# Patient Record
Sex: Male | Born: 1990 | Race: Black or African American | Hispanic: No | Marital: Single | Smoking: Current some day smoker
Health system: Southern US, Community
[De-identification: ages and names within clinical notes are randomized; demographics above are authoritative.]

## PROBLEM LIST (undated history)

## (undated) DIAGNOSIS — B20 Human immunodeficiency virus [HIV] disease: Secondary | ICD-10-CM

## (undated) DIAGNOSIS — B029 Zoster without complications: Secondary | ICD-10-CM

## (undated) DIAGNOSIS — L910 Hypertrophic scar: Secondary | ICD-10-CM

## (undated) HISTORY — PX: KELOID EXCISION: SHX1856

---

## 2011-03-22 ENCOUNTER — Encounter (HOSPITAL_COMMUNITY): Payer: Self-pay | Admitting: Emergency Medicine

## 2011-03-22 ENCOUNTER — Emergency Department (HOSPITAL_COMMUNITY)
Admission: EM | Admit: 2011-03-22 | Discharge: 2011-03-22 | Disposition: A | Discharge status: Home / Self Care | Payer: Medicaid Other | Attending: Emergency Medicine | Admitting: Emergency Medicine

## 2011-03-22 DIAGNOSIS — L509 Urticaria, unspecified: Secondary | ICD-10-CM | POA: Insufficient documentation

## 2011-03-22 DIAGNOSIS — F17200 Nicotine dependence, unspecified, uncomplicated: Secondary | ICD-10-CM | POA: Insufficient documentation

## 2011-03-22 DIAGNOSIS — L299 Pruritus, unspecified: Secondary | ICD-10-CM | POA: Insufficient documentation

## 2011-03-22 HISTORY — DX: Hypertrophic scar: L91.0

## 2011-03-22 MED ORDER — PREDNISONE 50 MG PO TABS: ORAL_TABLET | ORAL | Status: AC

## 2011-03-22 MED ORDER — DIPHENHYDRAMINE HCL 25 MG PO CAPS
25.0000 mg | ORAL_CAPSULE | Freq: Once | ORAL | Status: AC
  Administered 2011-03-22: 25 mg via ORAL
  Filled 2011-03-22: qty 1

## 2011-03-22 NOTE — ED Provider Notes (Signed)
I saw and evaluated the patient, reviewed the resident's note and I agree with the findings and plan. 21 year old, male, no significant past medical history.tattoos.  Yesterday.  Following that he developed a purpuric rash on his extremities.  He denies tightness in his throat.  He denies nausea, vomiting, lightheadedness, cough, wheezing, or difficulty breathing.  It appears that he got hives on his upper extremities.  He is in no, distress.  We'll give him Benadryl and steroids.  There is no other indication of anaphylaxis and no epinephrine or more aggressive treatment is indicated  Nicholes Stairs, MD 03/22/11 1022

## 2011-03-22 NOTE — Discharge Instructions (Signed)

## 2011-03-22 NOTE — ED Provider Notes (Signed)
I saw and evaluated the patient, reviewed the resident's note and I agree with the findings and plan.  Adan Baehr P Jeremian Whitby, MD 03/22/11 1608 

## 2011-03-22 NOTE — ED Notes (Signed)
Patient c/o allergic reaction. Per patient got new tattoo on chest last night, 3 hours later broke out in rash on arms bilaterally.

## 2011-03-22 NOTE — ED Provider Notes (Signed)
History     CSN: [id number]  Arrival date & time 03/22/11  1478   First MD Initiated Contact with Patient 03/22/11 0957      Chief Complaint  Patient presents with  . Allergic Reaction    HPI Patient states that last night he got a new tattoo.  He says before he went to bed, he noticed some red hives on his arms.  He says they were still there when he woke up this morning, and they itched a little.  He denies any throat or mouth swelling, any wheezing, shortness of breath.   The patient states this is his second tattoo and he did not have this reaction before, but he did go to a new tattoo parlor and believes they use a different brand of ink.   Past Medical History  Diagnosis Date  . Asthma   . Keloid scar of skin     Past Surgical History  Procedure Date  . Keloid excision     History reviewed. No pertinent family history.  History  Substance Use Topics  . Smoking status: Current Everyday Smoker    Types: Cigars  . Smokeless tobacco: Never Used  . Alcohol Use: No      Review of Systems  Constitutional: Negative for fever and diaphoresis.  HENT: Negative for congestion.   Eyes: Negative for visual disturbance.  Respiratory: Negative for shortness of breath and wheezing.   Cardiovascular: Negative for palpitations.  Gastrointestinal: Negative for abdominal pain.  Genitourinary: Negative for dysuria.  Musculoskeletal: Negative for back pain.  Skin: Positive for rash.  Neurological: Negative for numbness.  Hematological: Negative for adenopathy.    Allergies  Review of patient's allergies indicates no known allergies.  Home Medications  No current outpatient prescriptions on file.  BP 140/89  Pulse 94  Temp(Src) 98.5 F (36.9 C) (Oral)  Resp 18  Ht 5\' 11"  (1.803 m)  Wt 167 lb (75.751 kg)  BMI 23.29 kg/m2  SpO2 100%  Physical Exam  Constitutional: He is oriented to person, place, and time. He appears well-developed and well-nourished. No distress.    HENT:  Head: Normocephalic and atraumatic.  Nose: Nose normal.  Mouth/Throat: Oropharynx is clear and moist.  Eyes: Conjunctivae and EOM are normal. Pupils are equal, round, and reactive to light.  Neck: Normal range of motion. Neck supple.  Cardiovascular: Normal rate, regular rhythm, normal heart sounds and intact distal pulses.   Pulmonary/Chest: Effort normal and breath sounds normal. No respiratory distress. He has no wheezes.  Musculoskeletal: He exhibits no edema.  Neurological: He is alert and oriented to person, place, and time.  Skin:       Pt has several hives on left arm.  No other visible rash.     ED Course  Procedures (including critical care time)  Labs Reviewed - No data to display No results found.   1. Hives       MDM  Allergic reaction, likely to Tattoo ink.  Diagnosis discussed with patient.  Benadryl and Prednisone advised.  Pt advised to avoid tattoo ink in the future.        Ardyth Gal, MD 03/22/11 1029  Ardyth Gal, MD 03/22/11 1036

## 2011-07-01 ENCOUNTER — Emergency Department (HOSPITAL_COMMUNITY)
Admission: EM | Admit: 2011-07-01 | Discharge: 2011-07-01 | Disposition: A | Discharge status: Home / Self Care | Payer: Medicaid Other | Attending: Emergency Medicine | Admitting: Emergency Medicine

## 2011-07-01 ENCOUNTER — Encounter (HOSPITAL_COMMUNITY): Payer: Self-pay

## 2011-07-01 DIAGNOSIS — F17200 Nicotine dependence, unspecified, uncomplicated: Secondary | ICD-10-CM | POA: Insufficient documentation

## 2011-07-01 DIAGNOSIS — B029 Zoster without complications: Secondary | ICD-10-CM

## 2011-07-01 MED ORDER — PREDNISONE 50 MG PO TABS: 50.0000 mg | ORAL_TABLET | Freq: Every day | ORAL | Status: DC

## 2011-07-01 MED ORDER — VALACYCLOVIR HCL 1 G PO TABS: 1000.0000 mg | ORAL_TABLET | Freq: Three times a day (TID) | ORAL | Status: DC

## 2011-07-01 MED ORDER — OXYCODONE-ACETAMINOPHEN 5-325 MG PO TABS: 1.0000 | ORAL_TABLET | ORAL | Status: DC | PRN

## 2011-07-01 NOTE — ED Provider Notes (Signed)
History  This chart was scribed for Jon Booze, MD by Bennett Scrape. This patient was seen in room STRE7/STRE7 and the patient's care was started at 7:21PM.  CSN: [id number]  Arrival date & time 07/01/11  4098   First MD Initiated Contact with Patient 07/01/11 1921      Chief Complaint  Patient presents with  . Rash     The history is provided by the patient. No language interpreter was used.    Jon Lewis is a 21 y.o. male with a h/o asthma who presents to the Emergency Department complaining of a gradual onset, gradually worsening, constant rash described as blisters with a burning sensation to his left collarbone that appeared last night. He rates his pain a 4 or 5 out of 10. The pain is worse with the rub of clothing. He did not try any OTC medications to improve his symptoms. He also c/o enlarged lymph nodes in his neck on the left side. He denies any other symptoms. He is a current everyday smoker but denies alcohol use.  Dr. Normand Sloop is his PCP.  Past Medical History  Diagnosis Date  . Asthma   . Keloid scar of skin     Past Surgical History  Procedure Date  . Keloid excision     No family history on file.  History  Substance Use Topics  . Smoking status: Current Everyday Smoker    Types: Cigars  . Smokeless tobacco: Never Used  . Alcohol Use: No      Review of Systems  Constitutional: Negative for fever and chills.  Respiratory: Negative for shortness of breath.   Gastrointestinal: Negative for nausea and vomiting.  Skin: Positive for rash.  Neurological: Negative for weakness.    Allergies  Review of patient's allergies indicates no known allergies.  Home Medications  No current outpatient prescriptions on file.  Triage Vitals: BP 126/81  Pulse 105  Temp(Src) 98.3 F (36.8 C) (Oral)  Resp 14  SpO2 100%  Physical Exam  Nursing note and vitals reviewed. Constitutional: He is oriented to person, place, and time. He appears well-developed  and well-nourished. No distress.  HENT:  Head: Normocephalic and atraumatic.  Eyes: EOM are normal.  Neck: Neck supple. No tracheal deviation present.       Several enlarged left posterior cervical lymph nodes  Cardiovascular: Normal rate and regular rhythm.   Pulmonary/Chest: Effort normal and breath sounds normal. No respiratory distress.  Abdominal: He exhibits no distension.  Musculoskeletal: Normal range of motion.  Neurological: He is alert and oriented to person, place, and time.  Skin: Skin is warm and dry.       vesicular rash in the left upper chest anteriorly and laterally with one small patch in the left lateral neck  Psychiatric: He has a normal mood and affect. His behavior is normal.    ED Course  Procedures (including critical care time)  DIAGNOSTIC STUDIES: Oxygen Saturation is 100% on room air, normal by my interpretation.    COORDINATION OF CARE: 7:25PM-Discussed discharge plan of medications with pt and pt agreed to plan.    1. Herpes zoster       MDM  Rash appears to be mainly in a dermatome pattern. The fact that it is painful and not itchy and is strongly favors herpes zoster over any kind of contact dermatitis. He is sad discharged with a prescription for valacyclovir ear, prednisone, and Percocet.   I personally performed the services described in this documentation, which  was scribed in my presence. The recorded information has been reviewed and considered.         Jon Booze, MD 07/04/11 1505

## 2011-07-01 NOTE — ED Notes (Addendum)
Pt. Noticed a rash to  His lt. Anterior above his breast bone  They appear to be small fluid filled pockets pt. Reports them to be painful.  ? Poison

## 2011-07-01 NOTE — Discharge Instructions (Signed)
Shingles Shingles is caused by the same virus that causes chickenpox (varicella zoster virus or VZV). Shingles often occurs many years or decades after having chickenpox. That is why it is more common in adults older than 50 years. The virus reactivates and breaks out as an infection in a nerve root. SYMPTOMS   The initial feeling (sensations) may be pain. This pain is usually described as:   Burning.   Stabbing.   Throbbing.   Tingling in the nerve root.   A red rash will follow in a couple days. The rash may occur in any area of the body and is usually on one side (unilateral) of the body in a band or belt-like pattern. The rash usually starts out as very small blisters (vesicles). They will dry up after 7 to 10 days. This is not usually a significant problem except for the pain it causes.   Long-lasting (chronic) pain is more likely in an elderly person. It can last months to years. This condition is called postherpetic neuralgia.  Shingles can be an extremely severe infection in someone with AIDS, a weakened immune system, or with forms of leukemia. It can also be severe if you are taking transplant medicines or other medicines that weaken the immune system. TREATMENT  Your caregiver will often treat you with:  Antiviral drugs.   Anti-inflammatory drugs.   Pain medicines.  Bed rest is very important in preventing the pain associated with herpes zoster (postherpetic neuralgia). Application of heat in the form of a hot water bottle or electric heating pad or gentle pressure with the hand is recommended to help with the pain or discomfort. PREVENTION  A varicella zoster vaccine is available to help protect against the virus. The Food and Drug Administration approved the varicella zoster vaccine for individuals 47 years of age and older. HOME CARE INSTRUCTIONS   Cool compresses to the area of rash may be helpful.   Only take over-the-counter or prescription medicines for pain,  discomfort, or fever as directed by your caregiver.   Avoid contact with:   Babies.   Pregnant women.   Children with eczema.   Elderly people with transplants.   People with chronic illnesses, such as leukemia and AIDS.   If the area involved is on your face, you may receive a referral for follow-up to a specialist. It is very important to keep all follow-up appointments. This will help avoid eye complications, chronic pain, or disability.  SEEK IMMEDIATE MEDICAL CARE IF:   You develop any pain (headache) in the area of the face or eye. This must be followed carefully by your caregiver or ophthalmologist. An infection in part of your eye (cornea) can be very serious. It could lead to blindness.   You do not have pain relief from prescribed medicines.   Your redness or swelling spreads.   The area involved becomes very swollen and painful.   You have a fever.   You notice any red or painful lines extending away from the affected area toward your heart (lymphangitis).   Your condition is worsening or has changed.  Document Released: 01/18/2005 Document Revised: 01/07/2011 Document Reviewed: 12/23/2008 [facility] Patient Information 2012 [place], [place].  Valacyclovir caplets What is this medicine? VALACYCLOVIR (val ay SYE kloe veer) is an antiviral medicine. It is used to treat or prevent infections caused by certain kinds of viruses. Examples of these infections include herpes and shingles. This medicine will not cure herpes. This medicine may be used for other  purposes; ask your health care provider or pharmacist if you have questions. What should I tell my health care provider before I take this medicine? They need to know if you have any of these conditions: -acquired immunodeficiency syndrome (AIDS) -any other condition that may weaken the immune system -bone marrow or kidney transplant -kidney disease -an unusual or allergic reaction to valacyclovir, acyclovir,  ganciclovir, valganciclovir, other medicines, foods, dyes, or preservatives -pregnant or trying to get pregnant -breast-feeding How should I use this medicine? Take this medicine by mouth with a glass of water. Follow the directions on the prescription label. You can take this medicine with or without food. Take your doses at regular intervals. Do not take your medicine more often than directed. Finish the full course prescribed by your doctor or health care professional even if you think your condition is better. Do not stop taking except on the advice of your doctor or health care professional. Talk to your pediatrician regarding the use of this medicine in children. While this drug may be prescribed for children as young as 2 years for selected conditions, precautions do apply. Overdosage: If you think you have taken too much of this medicine contact a poison control center or emergency room at once. NOTE: This medicine is only for you. Do not share this medicine with others. What if I miss a dose? If you miss a dose, take it as soon as you can. If it is almost time for your next dose, take only that dose. Do not take double or extra doses. What may interact with this medicine? -cimetidine -probenecid This list may not describe all possible interactions. Give your health care provider a list of all the medicines, herbs, non-prescription drugs, or dietary supplements you use. Also tell them if you smoke, drink alcohol, or use illegal drugs. Some items may interact with your medicine. What should I watch for while using this medicine? Tell your doctor or health care professional if your symptoms do not start to get better after 1 week. This medicine works best when taken early in the course of an infection, within the first 72 hours. Begin treatment as soon as possible after the first signs of infection like tingling, itching, or pain in the affected area. It is possible that genital herpes may  still be spread even when you are not having symptoms. Always use safer sex practices like condoms made of latex or polyurethane whenever you have sexual contact. You should stay well hydrated while taking this medicine. Drink plenty of fluids. What side effects may I notice from receiving this medicine? Side effects that you should report to your doctor or health care professional as soon as possible: -allergic reactions like skin rash, itching or hives, swelling of the face, lips, or tongue -aggressive behavior -confusion -hallucinations -problems with balance, talking, walking -stomach pain -tremor -trouble passing urine or change in the amount of urine Side effects that usually do not require medical attention (report to your doctor or health care professional if they continue or are bothersome): -dizziness -headache -nausea, vomiting This list may not describe all possible side effects. Call your doctor for medical advice about side effects. You may report side effects to FDA at 1-800-FDA-1088. Where should I keep my medicine? Keep out of the reach of children. Store at room temperature between 15 and 25 degrees C (59 and 77 degrees F). Keep container tightly closed. Throw away any unused medicine after the expiration date. NOTE: This sheet is a  summary. It may not cover all possible information. If you have questions about this medicine, talk to your doctor, pharmacist, or health care provider.  2012, [employer]. (03/27/2007 6:10:08 PM)  Prednisone tablets What is this medicine? PREDNISONE (PRED ni sone) is a corticosteroid. It is commonly used to treat inflammation of the skin, joints, lungs, and other organs. Common conditions treated include asthma, allergies, and arthritis. It is also used for other conditions, such as blood disorders and diseases of the adrenal glands. This medicine may be used for other purposes; ask your health care provider or pharmacist if you  have questions. What should I tell my health care provider before I take this medicine? They need to know if you have any of these conditions: -Cushing's syndrome -diabetes -glaucoma -heart disease -high blood pressure -infection (especially a virus infection such as chickenpox, cold sores, or herpes) -kidney disease -liver disease -mental illness -myasthenia gravis -osteoporosis -seizures -stomach or intestine problems -thyroid disease -an unusual or allergic reaction to lactose, prednisone, other medicines, foods, dyes, or preservatives -pregnant or trying to get pregnant -breast-feeding How should I use this medicine? Take this medicine by mouth with a glass of water. Follow the directions on the prescription label. Take this medicine with food. If you are taking this medicine once a day, take it in the morning. Do not take more medicine than you are told to take. Do not suddenly stop taking your medicine because you may develop a severe reaction. Your doctor will tell you how much medicine to take. If your doctor wants you to stop the medicine, the dose may be slowly lowered over time to avoid any side effects. Talk to your pediatrician regarding the use of this medicine in children. Special care may be needed. Overdosage: If you think you have taken too much of this medicine contact a poison control center or emergency room at once. NOTE: This medicine is only for you. Do not share this medicine with others. What if I miss a dose? If you miss a dose, take it as soon as you can. If it is almost time for your next dose, talk to your doctor or health care professional. You may need to miss a dose or take an extra dose. Do not take double or extra doses without advice. What may interact with this medicine? Do not take this medicine with any of the following medications: -metyrapone -mifepristone This medicine may also interact with the following  medications: -aminoglutethimide -amphotericin B -aspirin and aspirin-like medicines -barbiturates -certain medicines for diabetes, like glipizide or glyburide -cholestyramine -cholinesterase inhibitors -cyclosporine -digoxin -diuretics -ephedrine -male hormones, like estrogens and birth control pills -isoniazid -ketoconazole -NSAIDS, medicines for pain and inflammation, like ibuprofen or naproxen -phenytoin -rifampin -toxoids -vaccines -warfarin This list may not describe all possible interactions. Give your health care provider a list of all the medicines, herbs, non-prescription drugs, or dietary supplements you use. Also tell them if you smoke, drink alcohol, or use illegal drugs. Some items may interact with your medicine. What should I watch for while using this medicine? Visit your doctor or health care professional for regular checks on your progress. If you are taking this medicine over a prolonged period, carry an identification card with your name and address, the type and dose of your medicine, and your doctor's name and address. This medicine may increase your risk of getting an infection. Tell your doctor or health care professional if you are around anyone with measles or chickenpox, or if  you develop sores or blisters that do not heal properly. If you are going to have surgery, tell your doctor or health care professional that you have taken this medicine within the last twelve months. Ask your doctor or health care professional about your diet. You may need to lower the amount of salt you eat. This medicine may affect blood sugar levels. If you have diabetes, check with your doctor or health care professional before you change your diet or the dose of your diabetic medicine. What side effects may I notice from receiving this medicine? Side effects that you should report to your doctor or health care professional as soon as possible: -allergic reactions like skin rash,  itching or hives, swelling of the face, lips, or tongue -changes in emotions or moods -changes in vision -depressed mood -eye pain -fever or chills, cough, sore throat, pain or difficulty passing urine -increased thirst -swelling of ankles, feet Side effects that usually do not require medical attention (report to your doctor or health care professional if they continue or are bothersome): -confusion, excitement, restlessness -headache -nausea, vomiting -skin problems, acne, thin and shiny skin -trouble sleeping -weight gain This list may not describe all possible side effects. Call your doctor for medical advice about side effects. You may report side effects to FDA at 1-800-FDA-1088. Where should I keep my medicine? Keep out of the reach of children. Store at room temperature between 15 and 30 degrees C (59 and 86 degrees F). Protect from light. Keep container tightly closed. Throw away any unused medicine after the expiration date. NOTE: This sheet is a summary. It may not cover all possible information. If you have questions about this medicine, talk to your doctor, pharmacist, or health care provider.  2012, [employer]. (09/03/2010 10:57:14 AM)  Acetaminophen; Oxycodone tablets What is this medicine? ACETAMINOPHEN; OXYCODONE (a set a MEE noe fen; ox i KOE done) is a pain reliever. It is used to treat mild to moderate pain. This medicine may be used for other purposes; ask your health care provider or pharmacist if you have questions. What should I tell my health care provider before I take this medicine? They need to know if you have any of these conditions: -brain tumor -Crohn's disease, inflammatory bowel disease, or ulcerative colitis -drink more than 3 alcohol containing drinks per day -drug abuse or addiction -head injury -heart or circulation problems -kidney disease or problems going to the bathroom -liver disease -lung disease, asthma, or breathing  problems -an unusual or allergic reaction to acetaminophen, oxycodone, other opioid analgesics, other medicines, foods, dyes, or preservatives -pregnant or trying to get pregnant -breast-feeding How should I use this medicine? Take this medicine by mouth with a full glass of water. Follow the directions on the prescription label. Take your medicine at regular intervals. Do not take your medicine more often than directed. Talk to your pediatrician regarding the use of this medicine in children. Special care may be needed. Patients over 17 years old may have a stronger reaction and need a smaller dose. Overdosage: If you think you have taken too much of this medicine contact a poison control center or emergency room at once. NOTE: This medicine is only for you. Do not share this medicine with others. What if I miss a dose? If you miss a dose, take it as soon as you can. If it is almost time for your next dose, take only that dose. Do not take double or extra doses. What may interact  with this medicine? -alcohol or medicines that contain alcohol -antihistamines -barbiturates like amobarbital, butalbital, butabarbital, methohexital, pentobarbital, phenobarbital, thiopental, and secobarbital -benztropine -drugs for bladder problems like solifenacin, trospium, oxybutynin, tolterodine, hyoscyamine, and methscopolamine -drugs for breathing problems like ipratropium and tiotropium -drugs for certain stomach or intestine problems like propantheline, homatropine methylbromide, glycopyrrolate, atropine, belladonna, and dicyclomine -general anesthetics like etomidate, ketamine, nitrous oxide, propofol, desflurane, enflurane, halothane, isoflurane, and sevoflurane -medicines for depression, anxiety, or psychotic disturbances -medicines for pain like codeine, morphine, pentazocine, buprenorphine, butorphanol, nalbuphine, tramadol, and propoxyphene -medicines for sleep -muscle  relaxants -naltrexone -phenothiazines like perphenazine, thioridazine, chlorpromazine, mesoridazine, fluphenazine, prochlorperazine, promazine, and trifluoperazine -scopolamine -trihexyphenidyl This list may not describe all possible interactions. Give your health care provider a list of all the medicines, herbs, non-prescription drugs, or dietary supplements you use. Also tell them if you smoke, drink alcohol, or use illegal drugs. Some items may interact with your medicine. What should I watch for while using this medicine? Tell your doctor or health care professional if your pain does not go away, if it gets worse, or if you have new or a different type of pain. You may develop tolerance to the medicine. Tolerance means that you will need a higher dose of the medication for pain relief. Tolerance is normal and is expected if you take this medicine for a long time. Do not suddenly stop taking your medicine because you may develop a severe reaction. Your body becomes used to the medicine. This does NOT mean you are addicted. Addiction is a behavior related to getting and using a drug for a nonmedical reason. If you have pain, you have a medical reason to take pain medicine. Your doctor will tell you how much medicine to take. If your doctor wants you to stop the medicine, the dose will be slowly lowered over time to avoid any side effects. You may get drowsy or dizzy. Do not drive, use machinery, or do anything that needs mental alertness until you know how this medicine affects you. Do not stand or sit up quickly, especially if you are an older patient. This reduces the risk of dizzy or fainting spells. Alcohol may interfere with the effect of this medicine. Avoid alcoholic drinks. The medicine will cause constipation. Try to have a bowel movement at least every 2 to 3 days. If you do not have a bowel movement for 3 days, call your doctor or health care professional. Do not take Tylenol (acetaminophen)  or medicines that have acetaminophen with this medicine. Too much acetaminophen can be very dangerous. Many nonprescription medicines contain acetaminophen. Always read the labels carefully to avoid taking more acetaminophen. What side effects may I notice from receiving this medicine? Side effects that you should report to your doctor or health care professional as soon as possible: -allergic reactions like skin rash, itching or hives, swelling of the face, lips, or tongue -breathing difficulties, wheezing -confusion -light headedness or fainting spells -severe stomach pain -yellowing of the skin or the whites of the eyes Side effects that usually do not require medical attention (report to your doctor or health care professional if they continue or are bothersome): -dizziness -drowsiness -nausea -vomiting This list may not describe all possible side effects. Call your doctor for medical advice about side effects. You may report side effects to FDA at 1-800-FDA-1088. Where should I keep my medicine? Keep out of the reach of children. This medicine can be abused. Keep your medicine in a safe place to protect it  from theft. Do not share this medicine with anyone. Selling or giving away this medicine is dangerous and against the law. Store at room temperature between 20 and 25 degrees C (68 and 77 degrees F). Keep container tightly closed. Protect from light. Flush any unused medicines down the toilet. Do not use the medicine after the expiration date. NOTE: This sheet is a summary. It may not cover all possible information. If you have questions about this medicine, talk to your doctor, pharmacist, or health care provider.  2012, [employer]. (12/18/2007 10:01:21 AM)

## 2011-07-07 ENCOUNTER — Encounter (HOSPITAL_COMMUNITY): Payer: Self-pay | Admitting: Emergency Medicine

## 2011-07-07 ENCOUNTER — Emergency Department (HOSPITAL_COMMUNITY)
Admission: EM | Admit: 2011-07-07 | Discharge: 2011-07-07 | Disposition: A | Discharge status: Home / Self Care | Payer: Medicaid Other | Attending: Emergency Medicine | Admitting: Emergency Medicine

## 2011-07-07 DIAGNOSIS — B029 Zoster without complications: Secondary | ICD-10-CM | POA: Insufficient documentation

## 2011-07-07 DIAGNOSIS — F17200 Nicotine dependence, unspecified, uncomplicated: Secondary | ICD-10-CM | POA: Insufficient documentation

## 2011-07-07 HISTORY — DX: Zoster without complications: B02.9

## 2011-07-07 MED ORDER — OXYCODONE-ACETAMINOPHEN 5-325 MG PO TABS: 1.0000 | ORAL_TABLET | ORAL | Status: AC | PRN

## 2011-07-07 MED ORDER — NAPROXEN 500 MG PO TABS: 500.0000 mg | ORAL_TABLET | Freq: Two times a day (BID) | ORAL | Status: DC

## 2011-07-07 NOTE — Discharge Instructions (Signed)
Shingles Shingles is caused by the same virus that causes chickenpox (varicella zoster virus or VZV). Shingles often occurs many years or decades after having chickenpox. That is why it is more common in adults older than 50 years. The virus reactivates and breaks out as an infection in a nerve root. SYMPTOMS   The initial feeling (sensations) may be pain. This pain is usually described as:   Burning.   Stabbing.   Throbbing.   Tingling in the nerve root.   A red rash will follow in a couple days. The rash may occur in any area of the body and is usually on one side (unilateral) of the body in a band or belt-like pattern. The rash usually starts out as very small blisters (vesicles). They will dry up after 7 to 10 days. This is not usually a significant problem except for the pain it causes.   Long-lasting (chronic) pain is more likely in an elderly person. It can last months to years. This condition is called postherpetic neuralgia.  Shingles can be an extremely severe infection in someone with AIDS, a weakened immune system, or with forms of leukemia. It can also be severe if you are taking transplant medicines or other medicines that weaken the immune system. TREATMENT  Your caregiver will often treat you with:  Antiviral drugs.   Anti-inflammatory drugs.   Pain medicines.  Bed rest is very important in preventing the pain associated with herpes zoster (postherpetic neuralgia). Application of heat in the form of a hot water bottle or electric heating pad or gentle pressure with the hand is recommended to help with the pain or discomfort. PREVENTION  A varicella zoster vaccine is available to help protect against the virus. The Food and Drug Administration approved the varicella zoster vaccine for individuals 15 years of age and older. HOME CARE INSTRUCTIONS   Cool compresses to the area of rash may be helpful.   Only take over-the-counter or prescription medicines for pain,  discomfort, or fever as directed by your caregiver.   Avoid contact with:   Babies.   Pregnant women.   Children with eczema.   Elderly people with transplants.   People with chronic illnesses, such as leukemia and AIDS.   If the area involved is on your face, you may receive a referral for follow-up to a specialist. It is very important to keep all follow-up appointments. This will help avoid eye complications, chronic pain, or disability.  SEEK IMMEDIATE MEDICAL CARE IF:   You develop any pain (headache) in the area of the face or eye. This must be followed carefully by your caregiver or ophthalmologist. An infection in part of your eye (cornea) can be very serious. It could lead to blindness.   You do not have pain relief from prescribed medicines.   Your redness or swelling spreads.   The area involved becomes very swollen and painful.   You have a fever.   You notice any red or painful lines extending away from the affected area toward your heart (lymphangitis).   Your condition is worsening or has changed.  Document Released: 01/18/2005 Document Revised: 01/07/2011 Document Reviewed: 12/23/2008 [facility] Patient Information 2012 [place], [place].RESOURCE GUIDE  Chronic Pain Problems: Contact [facility]  [phone] Patients need to be referred by their primary care doctor.  Insufficient Money for Medicine: Contact United Way:  call "211" or [facility] ([phone].  No Primary Care Doctor: - Call [facility]  [phone] - can  help you locate a primary care doctor that  accepts your insurance, provides certain services, etc. - Physician Referral Service- [phone]  Agencies that provide inexpensive medical care: - [facility]  [phone] - [facility]  [phone] - [facility]  [phone] - Women's Clinic  [phone] - Planned Parenthood  [phone] [facility]   [phone]  Medicaid-accepting [facility] Providers: - [facility]- [address]  [phone], Mon-Fri 9am-7pm, Sat 9am-1pm - [facility]- [address] [place], [address] [place]  [phone] - [facility]- [address], [address] [place]  [phone] [facility]- [address]  [phone] - Renaye Rakers- [address] [place], [address], [phone]  Only accepts [place] patients after they have their name  applied to their card  Self Pay (no insurance) in [place]: - Sickle Cell Patients: Dr Willey Blade, [facility]  [address] [place], [phone] - [facility]- [address] [place]  [phone]       [facility] [place]- [address]       -     [facility]- see information above (Speak to [employer] if you do not have insurance)       -  [facility]- [address] [place], [phone]       -  [facility]- [address],  [phone]       -  [facility]- [address], [phone]       -  Dr Julio Sicks-  [address], [address], [place], [phone]       -  [facility]- [address] [phone]       -  [facility]- [address], [phone], also [address], [phone]       -    [facility]- [address] [place], [phone], 1st & 3rd Saturday   every month, 10am-1pm  1) Find a Doctor and Pay Out of Pocket Although you won't have to find out who is covered by your insurance plan, it is a good idea to ask around and get recommendations. You will then need to call the office and see if the doctor you have chosen will accept you as a new patient and what types of options they offer for patients who are self-pay. Some doctors offer discounts or will set up payment plans for their patients who do not have insurance, but you will need to ask so you aren't  surprised when you get to your appointment.  2) Contact Your Local Health Department Not all health departments have doctors that can see patients for sick visits, but many do, so it is worth a call to see if yours does. If you don't know where your local health department is, you can check in your phone book. The CDC also has a tool to help you locate your state's health department, and many state websites also have listings of all of their local health departments.  3) Find a Walk-in Clinic If your illness is not likely to be very severe or complicated, you may want to try a walk in clinic. These are popping up all over the country in pharmacies, drugstores, and shopping centers. They're usually staffed  by nurse practitioners or physician assistants that have been trained to treat common illnesses and complaints. They're usually fairly quick and inexpensive. However, if you have serious medical issues or chronic medical problems, these are probably not your best option  STD Testing - [facility] Department of [facility], STD Clinic, [address], [place], phone [phone] or [phone].  Monday - Friday, call for an appointment. [facility] Department of [employer], STD Clinic, [address], [place], phone [phone] or [phone].  Monday - Friday, call for an appointment.  Abuse/Neglect: [facility] Child Abuse Hotline (506)504-2586 [facility] Child Abuse Hotline 6613880065 (After Hours)  Emergency Shelter:  [facility] (442)748-1125  Maternity Homes: - Room at the [place] [facility] 9025379609 - [facility] [phone]  MRSA Hotline #:   302-741-0455  [facility] Resources  [facility] [place]  United Way [facility] Dept. [address]                 [address]         [address] [place] [address]  Blondell Reveal Phone:  [phone]                                  Phone:  [phone]                   Phone:  [phone]  [facility], [phone] - [phone]       -     [facility] in [place], [address]                                  [phone], [facility] Child Abuse Hotline 623 628 0644 or 367-392-1781 (After Hours)   Behavioral Health Services  Substance Abuse Resources: - [facility] Drug Services  (762)182-9563 - [facility] [phone] - [facility] [place] [phone] Floydene Flock [phone] - Residential & Outpatient Substance Abuse Program  (815)[phone]  Psychological Services: [facility]  [phone] Services  [phone] - [facility], [phone] [place]. [address], [place], ACCESS LINE: [phone] or [phone], [employer]  Dental Assistance  If unable to pay or uninsured, contact:  [facility] or [facility]. to become qualified for the adult dental clinic.  Patients with Medicaid: [facility] [phone] [address] Qu[address], [phone] [address] [phone]  If unable to pay, or uninsured, contact [facility] [phone]) or [facility] Department [phone] in [place], [phone] in [facility]) to become qualified for the adult dental clinic  Other Low-Cost Community Dental Services: - [facility]- [address] [place], [place], [place], [address], [phone], [phone], 2nd and 4th Thursday of the  month at 6:30am.  10 clients each day by appointment, can sometimes see walk-in patients if someone does not show for an appointment. [facility]- [address] [place] Griffins [place], [place], [address], [phone] - [facility]- [address] [place], [place], [address], [phone] - [facility]- [phone] [facility]- [phone] [facility] [phone] -

## 2011-07-07 NOTE — ED Notes (Signed)
States  He needs a refill on his pain medication. Patient has been taking medication for the shingles x 1 week. Area is dry and scabbed over.

## 2011-07-07 NOTE — ED Notes (Signed)
Pt c/o needing pain med refill for shingles; pt sts getting better but still having pain

## 2011-07-07 NOTE — ED Provider Notes (Signed)
History   This chart was scribed for Jon Kras, MD by Melba Coon. The patient was seen in room STRE4/STRE4 and the patient's care was started at 2:17PM.  CSN: [id number] Arrival date & time 07/07/11  1349     Chief Complaint  Patient presents with  . Medication Refill    HPI Jon Lewis is a 21 y.o. male who presents to the Emergency Department needing a medication refill for shingles. Pt has had shingles for a week. Pt sx have been progressively getting better but pain is still present. No HA, fever, neck pain, sore throat, rash, back pain, CP, SOB, abd pain, n/v/d, dysuria, or extremity pain, edema, weakness, numbness, or tingling. No known allergies. No other pertinent medical symptoms. Patient was previously seen in the emergency department and started on oxycodone, valacyclovir and prednisone. The patient states he came to emergency however refill of his medications because it persists. He does not have a doctor in the area.  Past Medical History  Diagnosis Date  . Asthma   . Keloid scar of skin   . Shingles     Past Surgical History  Procedure Date  . Keloid excision     History reviewed. No pertinent family history.  History  Substance Use Topics  . Smoking status: Current Everyday Smoker    Types: Cigars  . Smokeless tobacco: Never Used  . Alcohol Use: No      Review of Systems 10 Systems reviewed and all are negative for acute change except as noted in the HPI.   Allergies  Review of patient's allergies indicates no known allergies.  Home Medications   Current Outpatient Rx  Name Route Sig Dispense Refill  . OXYCODONE-ACETAMINOPHEN 5-325 MG PO TABS Oral Take 1 tablet by mouth every 4 (four) hours as needed. For pain    . PREDNISONE 50 MG PO TABS Oral Take 50 mg by mouth daily.    Marland Kitchen VALACYCLOVIR HCL 1 G PO TABS Oral Take 1,000 mg by mouth 3 (three) times daily.      BP 145/82  Pulse 72  Temp(Src) 98 F (36.7 C) (Oral)  Resp 16  SpO2  100%  Physical Exam  Nursing note and vitals reviewed. Constitutional: He appears well-developed and well-nourished. No distress.  HENT:  Head: Normocephalic and atraumatic.  Right Ear: External ear normal.  Left Ear: External ear normal.  Eyes: Conjunctivae are normal. Right eye exhibits no discharge. Left eye exhibits no discharge. No scleral icterus.  Neck: Neck supple. No tracheal deviation present.  Cardiovascular: Normal rate.   Pulmonary/Chest: Effort normal. No stridor. No respiratory distress. He exhibits tenderness (Left anterior chest well with healing, scabbing area in pattern c/w herpes zoster).  Musculoskeletal: He exhibits no edema.  Neurological: He is alert. Cranial nerve deficit: no gross deficits.  Skin: Skin is warm and dry. No rash noted.  Psychiatric: He has a normal mood and affect.    ED Course  Procedures (including critical care time)  COORDINATION OF CARE:  2;20PM - EDMD will order pain meds for the pt.   Labs Reviewed - No data to display No results found.   1. Shingles       MDM  The patient's shingles seems to be healing. There is no evidence of bacterial superinfection. I did prescribe the patient additional pain medications and is requested. I recommended followup with a primary care Dr. as if the symptoms persist he may be a candidate for a medication such as gabapentin  I personally performed the services described in this documentation, which was scribed in my presence.  The recorded information has been reviewed and considered.        Jon Kras, MD 07/07/11 [phone]

## 2011-07-22 ENCOUNTER — Encounter (HOSPITAL_COMMUNITY): Payer: Self-pay | Admitting: *Deleted

## 2011-07-22 ENCOUNTER — Emergency Department (HOSPITAL_COMMUNITY)
Admission: EM | Admit: 2011-07-22 | Discharge: 2011-07-22 | Disposition: A | Discharge status: Home / Self Care | Payer: Medicaid Other | Attending: Emergency Medicine | Admitting: Emergency Medicine

## 2011-07-22 DIAGNOSIS — B029 Zoster without complications: Secondary | ICD-10-CM | POA: Insufficient documentation

## 2011-07-22 DIAGNOSIS — L282 Other prurigo: Secondary | ICD-10-CM

## 2011-07-22 DIAGNOSIS — F17200 Nicotine dependence, unspecified, uncomplicated: Secondary | ICD-10-CM | POA: Insufficient documentation

## 2011-07-22 MED ORDER — HYDROXYZINE HCL 25 MG PO TABS: 25.0000 mg | ORAL_TABLET | Freq: Four times a day (QID) | ORAL | Status: AC | PRN

## 2011-07-22 NOTE — ED Notes (Signed)
Patient has been seen here for his shingles.  He states his pain has been controlled but he continues to have itching to his neck and left shoulder

## 2011-07-22 NOTE — ED Provider Notes (Signed)
History  This chart was scribed for Gerhard Munch, MD by Bennett Scrape. This patient was seen in room TR05C/TR05C and the patient's care was started at 7:38PM.  CSN: [id number]  Arrival date & time 07/22/11  1745   First MD Initiated Contact with Patient 07/22/11 1938      Chief Complaint  Patient presents with  . Herpes Zoster  . Rash     The history is provided by the patient. No language interpreter was used.    Jon Lewis is a 21 y.o. male who presents to the Emergency Department complaining of 4 days of itchy rash that he attributes to shingles along the left neck and left shoulder. Pt was diagnosed one month ago with shingles. He denies having any modifying factors. He reports using cream without improvement in symptoms. He denies fevers, emesis and confusion as associated symptoms. He has a h/o asthma. He is a current everyday smoker but denies alcohol use.   Past Medical History  Diagnosis Date  . Asthma   . Keloid scar of skin   . Shingles     Past Surgical History  Procedure Date  . Keloid excision     No family history on file.  History  Substance Use Topics  . Smoking status: Current Everyday Smoker    Types: Cigars  . Smokeless tobacco: Never Used  . Alcohol Use: No      Review of Systems  Constitutional: Negative for chills.  Gastrointestinal: Negative for vomiting.  Skin: Positive for rash (Shingles).  All other systems reviewed and are negative.    Allergies  Review of patient's allergies indicates no known allergies.  Home Medications   Current Outpatient Rx  Name Route Sig Dispense Refill  . OXYCODONE-ACETAMINOPHEN 5-325 MG PO TABS Oral Take 1 tablet by mouth every 4 (four) hours as needed. For pain      Triage Vitals: BP 136/78  Pulse 69  Temp 98.3 F (36.8 C) (Oral)  Resp 16  SpO2 100%  Physical Exam  Nursing note and vitals reviewed. Constitutional: He is oriented to person, place, and time. He appears  well-developed. No distress.  HENT:  Head: Normocephalic and atraumatic.  Eyes: Conjunctivae and EOM are normal.  Cardiovascular: Normal rate.   Pulmonary/Chest: Effort normal. No stridor. No respiratory distress.  Abdominal: He exhibits no distension.  Musculoskeletal: He exhibits no edema.  Neurological: He is alert and oriented to person, place, and time.  Skin: Skin is warm and dry. Rash noted.       Rash characteristic of shingles along the left neck and left shoulder  Psychiatric: He has a normal mood and affect.    ED Course  Procedures (including critical care time)  COORDINATION OF CARE: 7:42PM-Discussed discharge plan with pt and pt agreed to plan.  Labs Reviewed - No data to display No results found.   No diagnosis found.    MDM  I personally performed the services described in this documentation, which was scribed in my presence. The recorded information has been reviewed and considered.  This young male who was diagnosed with herpes zoster 3 weeks ago now presents with concerns over pruritus.  Notably, the patient states that his pain has resolved entirely, and he denies any other focal complaints.  The patient was provided a prescription for hydroxyzine and discharged in stable condition.  Gerhard Munch, MD 07/22/11 [phone]

## 2011-07-22 NOTE — Discharge Instructions (Signed)
Is important that you establish care with a primary care physician to continue management of your condition.  If you develop any new, or concerning changes in your condition, please return to the emergency department immediately

## 2011-08-30 ENCOUNTER — Encounter (HOSPITAL_COMMUNITY): Payer: Self-pay

## 2011-08-30 ENCOUNTER — Emergency Department (HOSPITAL_COMMUNITY): Payer: No Typology Code available for payment source

## 2011-08-30 ENCOUNTER — Emergency Department (HOSPITAL_COMMUNITY)
Admission: EM | Admit: 2011-08-30 | Discharge: 2011-08-30 | Disposition: A | Discharge status: Home / Self Care | Payer: No Typology Code available for payment source | Attending: Emergency Medicine | Admitting: Emergency Medicine

## 2011-08-30 DIAGNOSIS — M545 Low back pain, unspecified: Secondary | ICD-10-CM

## 2011-08-30 DIAGNOSIS — S139XXA Sprain of joints and ligaments of unspecified parts of neck, initial encounter: Secondary | ICD-10-CM

## 2011-08-30 DIAGNOSIS — M542 Cervicalgia: Secondary | ICD-10-CM | POA: Insufficient documentation

## 2011-08-30 MED ORDER — ONDANSETRON HCL 4 MG/2ML IJ SOLN
4.0000 mg | Freq: Once | INTRAMUSCULAR | Status: DC
  Filled 2011-08-30: qty 2

## 2011-08-30 MED ORDER — ONDANSETRON 4 MG PO TBDP
4.0000 mg | ORAL_TABLET | Freq: Once | ORAL | Status: AC
  Administered 2011-08-30: 4 mg via ORAL
  Filled 2011-08-30: qty 1

## 2011-08-30 MED ORDER — MORPHINE SULFATE 4 MG/ML IJ SOLN
4.0000 mg | Freq: Once | INTRAMUSCULAR | Status: DC
  Filled 2011-08-30: qty 1

## 2011-08-30 MED ORDER — OXYCODONE-ACETAMINOPHEN 5-325 MG PO TABS
1.0000 | ORAL_TABLET | Freq: Once | ORAL | Status: AC
  Administered 2011-08-30: 1 via ORAL
  Filled 2011-08-30 (×2): qty 1

## 2011-08-30 MED ORDER — IBUPROFEN 800 MG PO TABS: 800.0000 mg | ORAL_TABLET | Freq: Three times a day (TID) | ORAL | Status: AC

## 2011-08-30 MED ORDER — OXYCODONE-ACETAMINOPHEN 5-325 MG PO TABS: ORAL_TABLET | ORAL | Status: AC
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
## 2011-08-30 NOTE — ED Notes (Signed)
c-collar removed

## 2011-08-30 NOTE — ED Provider Notes (Signed)
Medical screening examination/treatment/procedure(s) were performed by non-physician practitioner and as supervising physician I was immediately available for consultation/collaboration.   Gwyneth Sprout, MD 08/30/11 [phone]
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
## 2011-08-30 NOTE — ED Notes (Signed)
Pt. Removed from the Board by Wynetta Emery. PA

## 2011-11-29 ENCOUNTER — Telehealth: Payer: Self-pay

## 2011-11-29 NOTE — Telephone Encounter (Signed)
error 

## 2011-11-29 NOTE — Telephone Encounter (Signed)
Attempted to reach patient regarding referral from [facility] for  new HIV referral .  Cell number does not list name, no message left.  I will attempt to reach patient on a different date and time with hopes of speaking with the patient directly. I prefer not to leave phone messages on blind voice mails.   Laurell Josephs, RN

## 2011-12-03 ENCOUNTER — Telehealth: Payer: Self-pay

## 2011-12-03 NOTE — Telephone Encounter (Signed)
Message left on cell phone to call Eustacia Urbanek to schedule new patient visit.   Laurell Josephs, RN

## 2011-12-03 NOTE — Telephone Encounter (Signed)
Pt returned call and scheduled visit.   12-21-11 @ 1:30 pm  Laurell Josephs, RN

## 2011-12-21 ENCOUNTER — Ambulatory Visit (INDEPENDENT_AMBULATORY_CARE_PROVIDER_SITE_OTHER): Payer: Medicaid Other

## 2011-12-21 ENCOUNTER — Other Ambulatory Visit (HOSPITAL_COMMUNITY)
Admission: RE | Admit: 2011-12-21 | Discharge: 2011-12-21 | Disposition: A | Discharge status: Home / Self Care | Payer: Medicaid Other | Source: Ambulatory Visit | Attending: Infectious Diseases | Admitting: Infectious Diseases

## 2011-12-21 DIAGNOSIS — Z21 Asymptomatic human immunodeficiency virus [HIV] infection status: Secondary | ICD-10-CM

## 2011-12-21 DIAGNOSIS — Z113 Encounter for screening for infections with a predominantly sexual mode of transmission: Secondary | ICD-10-CM | POA: Insufficient documentation

## 2011-12-21 DIAGNOSIS — Z23 Encounter for immunization: Secondary | ICD-10-CM

## 2011-12-21 DIAGNOSIS — B20 Human immunodeficiency virus [HIV] disease: Secondary | ICD-10-CM

## 2011-12-22 LAB — CBC WITH DIFFERENTIAL/PLATELET
Basophils Relative: 0 % (ref 0–1)
Eosinophils Absolute: 0.2 10*3/uL (ref 0.0–0.7)
Eosinophils Relative: 2 % (ref 0–5)
Hemoglobin: 12.8 g/dL — ABNORMAL LOW (ref 13.0–17.0)
Lymphs Abs: 1.6 10*3/uL (ref 0.7–4.0)
MCH: 30.3 pg (ref 26.0–34.0)
MCHC: 35.1 g/dL (ref 30.0–36.0)
MCV: 86.3 fL (ref 78.0–100.0)
Monocytes Relative: 16 % — ABNORMAL HIGH (ref 3–12)
Neutrophils Relative %: 61 % (ref 43–77)
RBC: 4.23 MIL/uL (ref 4.22–5.81)

## 2011-12-22 LAB — COMPLETE METABOLIC PANEL WITH GFR
Alkaline Phosphatase: 50 U/L (ref 39–117)
BUN: 9 mg/dL (ref 6–23)
CO2: 27 mEq/L (ref 19–32)
Creat: 1.19 mg/dL (ref 0.50–1.35)
GFR, Est African American: >89 mL/min
GFR, Est Non African American: 87 mL/min
Glucose, Bld: 90 mg/dL (ref 70–99)
Total Bilirubin: 0.5 mg/dL (ref 0.3–1.2)

## 2011-12-22 LAB — URINALYSIS
Bilirubin Urine: NEGATIVE
Glucose, UA: NEGATIVE mg/dL
Hgb urine dipstick: NEGATIVE
pH: 6 (ref 5.0–8.0)

## 2011-12-22 LAB — HEPATITIS B CORE ANTIBODY, TOTAL: Hep B Core Total Ab: NEGATIVE

## 2011-12-22 LAB — LIPID PANEL
HDL: 32 mg/dL — ABNORMAL LOW (ref >39)
Total CHOL/HDL Ratio: 4 Ratio
VLDL: 11 mg/dL (ref 0–40)

## 2011-12-22 LAB — T-HELPER CELL (CD4) - (RCID CLINIC ONLY)
CD4 % Helper T Cell: 13 % — ABNORMAL LOW (ref 33–55)
CD4 T Cell Abs: 210 uL — ABNORMAL LOW (ref 400–2700)

## 2011-12-22 LAB — HEPATITIS B SURFACE ANTIBODY,QUALITATIVE: Hep B S Ab: POSITIVE — AB

## 2011-12-22 LAB — RPR

## 2011-12-22 LAB — HEPATITIS B SURFACE ANTIGEN: Hepatitis B Surface Ag: NEGATIVE

## 2011-12-23 LAB — HIV-1 RNA ULTRAQUANT REFLEX TO GENTYP+: HIV 1 RNA Quant: 76174 copies/mL — ABNORMAL HIGH (ref <20)

## 2011-12-29 LAB — HIV-1 GENOTYPR PLUS

## 2012-01-03 NOTE — Progress Notes (Signed)
Pt diagnosed 2012.  He did not seek care and does not really know why he avoided treatment.   DIS recently found patient and made referral.  Pt states his only symptom has been swollen neck and  groin glands. He also noticed a "knot" on his left ear.    His Mother is not aware of his status.   Laurell Josephs, RN

## 2012-01-05 ENCOUNTER — Ambulatory Visit (INDEPENDENT_AMBULATORY_CARE_PROVIDER_SITE_OTHER): Payer: Medicaid Other | Admitting: Internal Medicine

## 2012-01-05 ENCOUNTER — Encounter: Payer: Self-pay | Admitting: Internal Medicine

## 2012-01-05 VITALS — BP 136/76 | HR 84 | Temp 98.1°F | Wt 163.0 lb

## 2012-01-05 DIAGNOSIS — B20 Human immunodeficiency virus [HIV] disease: Secondary | ICD-10-CM | POA: Insufficient documentation

## 2012-01-05 MED ORDER — ELVITEG-COBIC-EMTRICIT-TENOFDF 150-150-200-300 MG PO TABS: 1.0000 | ORAL_TABLET | Freq: Every day | ORAL | Status: DC
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
## 2012-01-05 NOTE — Assessment & Plan Note (Signed)
I discussed at length all of the medication options and he has opted for Stribild. He does feel he will be able to take medication daily without missed doses. I did explain resistance and the need to be dedicated to taking the medication daily. Voiced his understanding.  He knows that he may have some fatigue and other symptoms as his body adjusts to fighting the virus. He knows that this is lifelong therapy and that if he is off of medication that his virus will rise again. He knows to call if he has any problems getting the medication or with any side effects and he knows to call prior to stopping any medications.  More than 60 minutes was spent including face-to-face contact, counseling and coordination of care. He'll return in one month for labs on the medication and I will see him 2 weeks after that.

## 2012-02-08 ENCOUNTER — Other Ambulatory Visit: Payer: Self-pay | Admitting: *Deleted

## 2012-02-08 DIAGNOSIS — B20 Human immunodeficiency virus [HIV] disease: Secondary | ICD-10-CM

## 2012-02-08 MED ORDER — ELVITEG-COBIC-EMTRICIT-TENOFDF 150-150-200-300 MG PO TABS: 1.0000 | ORAL_TABLET | Freq: Every day | ORAL | Status: DC

## 2012-02-08 NOTE — Telephone Encounter (Signed)
Patient need for ADAP Rx

## 2012-02-22 ENCOUNTER — Other Ambulatory Visit: Payer: Self-pay | Admitting: *Deleted

## 2012-02-22 DIAGNOSIS — B20 Human immunodeficiency virus [HIV] disease: Secondary | ICD-10-CM

## 2012-02-22 MED ORDER — ELVITEG-COBIC-EMTRICIT-TENOFDF 150-150-200-300 MG PO TABS: 1.0000 | ORAL_TABLET | Freq: Every day | ORAL | Status: DC

## 2012-10-17 ENCOUNTER — Telehealth: Payer: Self-pay | Admitting: *Deleted

## 2012-10-17 NOTE — Telephone Encounter (Signed)
Called and left a message for patient to call the clinic to schedule a f/u appt. The person that answered wanted to know what type of MD and I said to please have him call Dr. Ephriam Knuckles office. Wendall Mola

## 2012-12-06 ENCOUNTER — Telehealth: Payer: Self-pay | Admitting: *Deleted

## 2012-12-06 DIAGNOSIS — Z113 Encounter for screening for infections with a predominantly sexual mode of transmission: Secondary | ICD-10-CM

## 2012-12-06 DIAGNOSIS — Z79899 Other long term (current) drug therapy: Secondary | ICD-10-CM

## 2012-12-06 DIAGNOSIS — B20 Human immunodeficiency virus [HIV] disease: Secondary | ICD-10-CM

## 2012-12-06 NOTE — Telephone Encounter (Signed)
Needing f/u lab and MD appts.  Pt to walk-in for lab work as his work schedule allows.  Will come in between 0900 and 1100.
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
## 2012-12-20 ENCOUNTER — Ambulatory Visit: Payer: Self-pay | Admitting: Internal Medicine

## 2012-12-20 ENCOUNTER — Telehealth: Payer: Self-pay | Admitting: *Deleted

## 2012-12-20 NOTE — Telephone Encounter (Signed)
Patient referred to [facility] for multiple no shows. Last seen 01/2012. Jon Lewis

## 2013-01-02 ENCOUNTER — Encounter: Payer: Self-pay | Admitting: Internal Medicine

## 2013-01-02 ENCOUNTER — Telehealth: Payer: Self-pay | Admitting: *Deleted

## 2013-01-02 ENCOUNTER — Ambulatory Visit (INDEPENDENT_AMBULATORY_CARE_PROVIDER_SITE_OTHER): Payer: No Typology Code available for payment source | Admitting: Internal Medicine

## 2013-01-02 VITALS — BP 136/77 | HR 58 | Temp 98.1°F | Ht 71.0 in | Wt 162.0 lb

## 2013-01-02 DIAGNOSIS — B20 Human immunodeficiency virus [HIV] disease: Secondary | ICD-10-CM

## 2013-01-02 DIAGNOSIS — Z79899 Other long term (current) drug therapy: Secondary | ICD-10-CM

## 2013-01-02 DIAGNOSIS — Z113 Encounter for screening for infections with a predominantly sexual mode of transmission: Secondary | ICD-10-CM

## 2013-01-02 DIAGNOSIS — Z23 Encounter for immunization: Secondary | ICD-10-CM

## 2013-01-02 NOTE — Progress Notes (Signed)
   Subjective:    Patient ID: Jon Lewis, male    DOB: Mar 15, 1990, 22 y.o.   MRN: [id number]  HPI He comes in for followup of his HIV. His first diagnosed in 2013 and was first seen as a new patient in December 2013. After discussion with him we opted to start him on Stribild. Unfortunately he never returned. He though did start Stribild about 3 or 4 months ago with his Medicaid though recently was told that he no longer has Medicaid. He has no idea of why he had Medicaid or if he will be able to continue to have Medicaid so did spend a considerable amount of time with the financial planner in is applying for the drug assistance program. He has not had any problems on the Stribild and no side effects. He has been out about 4 days. No weight loss, no diarrhea, no shortness of breath or swallowing difficulties.   Review of Systems  Constitutional: Negative for fever, appetite change and fatigue.  HENT: Negative for trouble swallowing.   Eyes: Negative for visual disturbance.  Respiratory: Negative for cough and shortness of breath.   Cardiovascular: Negative for chest pain.  Gastrointestinal: Negative for nausea, abdominal pain and diarrhea.  Genitourinary: Negative for discharge.  Musculoskeletal: Negative for joint swelling.  Skin: Negative for rash.  Neurological: Negative for dizziness, light-headedness and headaches.  Hematological: Negative for adenopathy.  Psychiatric/Behavioral: Negative for dysphoric mood.       Objective:   Physical Exam  Constitutional: He is oriented to person, place, and time. He appears well-developed and well-nourished. No distress.  HENT:  Mouth/Throat: No oropharyngeal exudate.  Eyes: Right eye exhibits no discharge. Left eye exhibits no discharge. No scleral icterus.  Cardiovascular: Normal rate, regular rhythm and normal heart sounds.   No murmur heard. Pulmonary/Chest: Effort normal and breath sounds normal. No respiratory distress. He has no  wheezes.  Lymphadenopathy:    He has no cervical adenopathy.  Neurological: He is alert and oriented to person, place, and time.  Skin: Skin is warm and dry. No rash noted.  Psychiatric: He has a normal mood and affect. His behavior is normal.          Assessment & Plan:

## 2013-01-02 NOTE — Assessment & Plan Note (Addendum)
I discussed the need to continue with treatment uninterrupted. I discussed resistance with him. He did go through some of the paperwork for drug assistance program. We'll need to wait several weeks before he is able to get his medication and I will have him get a lab visit 3-4 weeks after he starts his medications. I will then see him after that. Stress compliance and possibility of resistance

## 2013-01-02 NOTE — Telephone Encounter (Signed)
Called patient to see if he was coming, as he was 30 minutes late. Pt arrived in office.  Dr. Luciana Axe will see him. Andree Coss, RN

## 2013-01-10 ENCOUNTER — Other Ambulatory Visit: Payer: Self-pay

## 2013-01-31 ENCOUNTER — Telehealth: Payer: Self-pay | Admitting: *Deleted

## 2013-01-31 NOTE — Telephone Encounter (Signed)
Message copied by Andree Coss on Wed Jan 31, 2013  8:18 AM ------      Message from: Gardiner Barefoot      Created: Tue Jan 30, 2013  4:59 PM       Can you see if he was able to get his mediciation and started it (Stribild).  thanks ------

## 2013-01-31 NOTE — Telephone Encounter (Signed)
No answer and was unable to leave message at both listed numbers.  Per pharmacy, they have not filled anything for him since September.  Sent pt MyChart message.  Patient has appointment (lab, ADAP renewal) on 1/16. Andree Coss, RN

## 2013-02-16 ENCOUNTER — Other Ambulatory Visit: Payer: Self-pay

## 2013-02-16 ENCOUNTER — Ambulatory Visit: Payer: Self-pay

## 2013-02-27 ENCOUNTER — Ambulatory Visit: Payer: Self-pay | Admitting: Internal Medicine

## 2013-06-26 ENCOUNTER — Telehealth: Payer: Self-pay | Admitting: *Deleted

## 2013-06-26 NOTE — Telephone Encounter (Signed)
Requesting appt.  Appt made for 06/28/13 w/ Dr. Luciana Axe.

## 2013-06-28 ENCOUNTER — Ambulatory Visit (INDEPENDENT_AMBULATORY_CARE_PROVIDER_SITE_OTHER): Payer: Self-pay | Admitting: Internal Medicine

## 2013-06-28 ENCOUNTER — Other Ambulatory Visit: Payer: Self-pay | Admitting: Internal Medicine

## 2013-06-28 ENCOUNTER — Encounter: Payer: Self-pay | Admitting: Internal Medicine

## 2013-06-28 VITALS — BP 128/92 | HR 89 | Temp 98.3°F | Wt 152.0 lb

## 2013-06-28 DIAGNOSIS — Z79899 Other long term (current) drug therapy: Secondary | ICD-10-CM

## 2013-06-28 DIAGNOSIS — K859 Acute pancreatitis without necrosis or infection, unspecified: Secondary | ICD-10-CM

## 2013-06-28 DIAGNOSIS — Z113 Encounter for screening for infections with a predominantly sexual mode of transmission: Secondary | ICD-10-CM

## 2013-06-28 DIAGNOSIS — B20 Human immunodeficiency virus [HIV] disease: Secondary | ICD-10-CM

## 2013-06-28 DIAGNOSIS — R197 Diarrhea, unspecified: Secondary | ICD-10-CM

## 2013-06-28 LAB — COMPLETE METABOLIC PANEL WITH GFR
ALT: 14 U/L (ref 0–53)
AST: 30 U/L (ref 0–37)
Albumin: 3.9 g/dL (ref 3.5–5.2)
Alkaline Phosphatase: 61 U/L (ref 39–117)
BILIRUBIN TOTAL: 0.4 mg/dL (ref 0.2–1.2)
BUN: 8 mg/dL (ref 6–23)
CALCIUM: 8.7 mg/dL (ref 8.4–10.5)
CO2: 27 mEq/L (ref 19–32)
CREATININE: 1 mg/dL (ref 0.50–1.35)
Chloride: 108 mEq/L (ref 96–112)
GFR, Est Non African American: >89 mL/min
Glucose, Bld: 76 mg/dL (ref 70–99)
Potassium: 3.6 mEq/L (ref 3.5–5.3)
Sodium: 140 mEq/L (ref 135–145)
Total Protein: 6.7 g/dL (ref 6.0–8.3)

## 2013-06-28 LAB — LIPID PANEL
Cholesterol: 90 mg/dL (ref 0–200)
HDL: 24 mg/dL — AB (ref >39)
LDL Cholesterol: 50 mg/dL (ref 0–99)
TRIGLYCERIDES: 78 mg/dL (ref <150)
Total CHOL/HDL Ratio: 3.8 Ratio
VLDL: 16 mg/dL (ref 0–40)

## 2013-06-28 MED ORDER — ELVITEG-COBIC-EMTRICIT-TENOFDF 150-150-200-300 MG PO TABS: 1.0000 | ORAL_TABLET | Freq: Every day | ORAL | Status: DC

## 2013-06-28 NOTE — Progress Notes (Signed)
Subjective:    Patient ID: Jon Lewis, male    DOB: [birth year], 23 y.o.   MRN: [id number]  HPI 23yo M with Hiv, Cd 4 count of 210 (13%)/VL 76, 174(nov 2013) last seen in ID clinic in December but appears to also get some care at [facility]. He reports being off of stribild for 2 months. He did have episode of pancreatitis requiring [facility] in April. He reports having similar symptoms which started roughly  9 days ago consisting of n/v/d. He reports having decreased greasy or fatty foods. He is able to stay hydrated, starting to feel better with exception to diarrhea.  He is here today with casey, bridge counselor to help with reapplication for adap.  Reviewed labs on care everywhere. Lipase elevated in April c/w pancreatitis  No current outpatient prescriptions on file prior to visit.   No current facility-administered medications on file prior to visit.   Active Ambulatory Problems    Diagnosis Date Noted  . HIV disease 01/05/2012  . Pancreatitis, acute 06/28/2013   Resolved Ambulatory Problems    Diagnosis Date Noted  . No Resolved Ambulatory Problems   Past Medical History  Diagnosis Date  . Asthma   . Keloid scar of skin   . Shingles        Review of Systems Review of Systems  Constitutional: Negative for fever, chills, diaphoresis, activity change, appetite change, fatigue and unexpected weight change.  HENT: Negative for congestion, sore throat, rhinorrhea, sneezing, trouble swallowing and sinus pressure.  Eyes: Negative for photophobia and visual disturbance.  Respiratory: Negative for cough, chest tightness, shortness of breath, wheezing and stridor.  Cardiovascular: Negative for chest pain, palpitations and leg swelling.  Gastrointestinal: positive for nausea, vomiting, abdominal pain, diarrhea,but negative constipation, blood in stool, abdominal distention and anal bleeding.  Genitourinary: Negative for dysuria, hematuria, flank pain and  difficulty urinating.  Musculoskeletal: Negative for myalgias, back pain, joint swelling, arthralgias and gait problem.  Skin: Negative for color change, pallor, rash and wound.  Neurological: Negative for dizziness, tremors, weakness and light-headedness.  Hematological: Negative for adenopathy. Does not bruise/bleed easily.  Psychiatric/Behavioral: Negative for behavioral problems, confusion, sleep disturbance, dysphoric mood, decreased concentration and agitation.       Objective:   Physical Exam BP 128/92  Pulse 89  Temp(Src) 98.3 F (36.8 C) (Oral)  Wt 152 lb (68.947 kg) Physical Exam  Constitutional: He is oriented to person, place, and time. He appears well-developed and well-nourished. No distress.  HENT:  Mouth/Throat: Oropharynx is clear and moist. No oropharyngeal exudate.  Cardiovascular: Normal rate, regular rhythm and normal heart sounds. Exam reveals no gallop and no friction rub.  No murmur heard.  Pulmonary/Chest: Effort normal and breath sounds normal. No respiratory distress. He has no wheezes.  Abdominal: Soft. Bowel sounds are decreased. He exhibits no distension. epigastric tenderness.  Lymphadenopathy:  He has no cervical adenopathy.  Neurological: He is alert and oriented to person, place, and time.  Skin: Skin is warm and dry. No rash noted. No erythema.  Psychiatric: He has a normal mood and affect. His behavior is normal.       Assessment & Plan:  HIV = will represcribe stribild. Appears that we are able to get 30 day supply relatively quickly. adap submitted on 5/28 for approval. Will check labs inc vl with genotype.  oi proph = will be based upon his cd 4 count/%  Recurrent pancreatitis= will check lipase, and review recent imaging. Continue with bland diet.  If having increasing pain. Recommend to fast but keep hydrated. He denies need for pain meds at this time.  Diarrhea = could be related to hiv. Will check cdiff and stool culture  rtc in 4-6  wk for labs and visit.

## 2013-06-29 ENCOUNTER — Telehealth: Payer: Self-pay | Admitting: Licensed Clinical Social Worker

## 2013-06-29 ENCOUNTER — Other Ambulatory Visit: Payer: Self-pay | Admitting: Licensed Clinical Social Worker

## 2013-06-29 ENCOUNTER — Other Ambulatory Visit: Payer: Self-pay | Admitting: *Deleted

## 2013-06-29 DIAGNOSIS — B20 Human immunodeficiency virus [HIV] disease: Secondary | ICD-10-CM

## 2013-06-29 LAB — CLOSTRIDIUM DIFFICILE BY PCR: Toxigenic C. Difficile by PCR: NOT DETECTED

## 2013-06-29 LAB — CBC WITH DIFFERENTIAL/PLATELET
Basophils Absolute: 0.1 10*3/uL (ref 0.0–0.1)
Basophils Relative: 1 % (ref 0–1)
Eosinophils Absolute: 0.1 10*3/uL (ref 0.0–0.7)
Eosinophils Relative: 1 % (ref 0–5)
HCT: 38.9 % — ABNORMAL LOW (ref 39.0–52.0)
Hemoglobin: 13.7 g/dL (ref 13.0–17.0)
LYMPHS PCT: 16 % (ref 12–46)
Lymphs Abs: 1.6 10*3/uL (ref 0.7–4.0)
MCH: 30.1 pg (ref 26.0–34.0)
MCHC: 35.2 g/dL (ref 30.0–36.0)
MCV: 85.5 fL (ref 78.0–100.0)
Monocytes Absolute: 1.2 10*3/uL — ABNORMAL HIGH (ref 0.1–1.0)
Monocytes Relative: 12 % (ref 3–12)
NEUTROS PCT: 70 % (ref 43–77)
Neutro Abs: 6.9 10*3/uL (ref 1.7–7.7)
Platelets: 251 10*3/uL (ref 150–400)
RBC: 4.55 MIL/uL (ref 4.22–5.81)
RDW: 14.4 % (ref 11.5–15.5)
WBC: 9.9 10*3/uL (ref 4.0–10.5)

## 2013-06-29 LAB — RPR

## 2013-06-29 LAB — T-HELPER CELL (CD4) - (RCID CLINIC ONLY)
CD4 % Helper T Cell: 20 % — ABNORMAL LOW (ref 33–55)
CD4 T CELL ABS: 280 /uL — AB (ref 400–2700)

## 2013-06-29 LAB — HIV-1 RNA QUANT-NO REFLEX-BLD
HIV 1 RNA QUANT: 1199602 {copies}/mL — AB (ref <20)
HIV-1 RNA QUANT, LOG: 6.08 {Log} — AB (ref <1.30)

## 2013-06-29 MED ORDER — ELVITEG-COBIC-EMTRICIT-TENOFDF 150-150-200-300 MG PO TABS: 1.0000 | ORAL_TABLET | Freq: Every day | ORAL | Status: AC

## 2013-06-29 NOTE — Progress Notes (Signed)
See provider note from Dr. Drue Second

## 2013-06-29 NOTE — Telephone Encounter (Signed)
Patient states he is still not ready to go back to work, he was excused through today. Can he get another note excusing him through Monday to start back on Tuesday?  Fax# [phone]  Attn: Babette Relic

## 2013-06-29 NOTE — Telephone Encounter (Signed)
yes

## 2013-07-02 LAB — STOOL CULTURE

## 2013-07-05 ENCOUNTER — Telehealth: Payer: Self-pay | Admitting: *Deleted

## 2013-07-05 NOTE — Telephone Encounter (Signed)
Abd. pain and swelling, Diarrhea, recent pancreatitis rxed @ [facility].  Requesting appt.  First available 07/09/13 w/ Dr. Ninetta Lights.  Pt had received rx for vomiting from [facility] but could not fill due to cost.  Recommended that he take the rx to the [facility] on [address] in [place] to obtain.  [facility] is located on [address].  Pt stated that he would take the rx there for filling.

## 2013-07-09 ENCOUNTER — Telehealth: Payer: Self-pay | Admitting: *Deleted

## 2013-07-09 ENCOUNTER — Ambulatory Visit: Payer: Self-pay | Admitting: Infectious Diseases

## 2013-07-09 NOTE — Telephone Encounter (Signed)
Pt called wanting to know what was going on with his pancreatitis, stating that he was getting no help.  Patient no-showed his appointment with Dr. Ninetta Lights 2 hours earlier this morning.  RN advised patient that he could have an appointment tomorrow with Dr. Luciana Axe at 4:15 and must keep it.  If he cannot, or if he is in too much pain, RN advised him to return to the ED where he was worked up for the suspected pancreatitis. Andree Coss, RN

## 2013-07-10 ENCOUNTER — Ambulatory Visit: Payer: Self-pay | Admitting: Internal Medicine

## 2013-07-10 ENCOUNTER — Telehealth: Payer: Self-pay | Admitting: *Deleted

## 2013-07-10 NOTE — Telephone Encounter (Signed)
Patient has requested and subsequently no-showed 2 appointments 2 days in a row.  He states that he "just lost track of time today."  He requested these appointments for follow up from [facility] ED 5/29 for suspected pancreatitis.  Per Dr. Luciana Axe, his labs were normal.  Patient states this is not possible, wants to know what he can do about it.  RN repeated that he needs to discuss this with a doctor.  Patient wanted to know if he could have an appointment tomorrow.  RN stated that he has no-showed too many appointments and must be a walk-in now.  Pt given walk-in hours, informed that he may or may see the doctor depending on the day's schedule.  Pt verbalized understanding. Andree Coss, RN

## 2013-07-11 ENCOUNTER — Other Ambulatory Visit: Payer: Self-pay | Admitting: *Deleted

## 2013-07-11 DIAGNOSIS — B20 Human immunodeficiency virus [HIV] disease: Secondary | ICD-10-CM

## 2013-07-11 MED ORDER — ELVITEG-COBIC-EMTRICIT-TENOFDF 150-150-200-300 MG PO TABS: ORAL_TABLET | ORAL | Status: DC

## 2013-07-11 NOTE — Telephone Encounter (Signed)
ADAP application 

## 2013-08-08 ENCOUNTER — Telehealth: Payer: Self-pay | Admitting: *Deleted

## 2013-08-08 NOTE — Telephone Encounter (Signed)
Vomiting and diarrhea x 1 week.  Pt requesting an appt for today.  Currently at work today.  None available.  Pt already has a scheduled appt w/ Dr. Luciana Axeomer for tomorrow.  Per Dr. Luciana Axeomer pt should go to an Urgent Care.  Pt asked whether this would cost him anything.  RN advised that there would be a charge for the Urgent Care visit.  RN advised the pt to start clear liquids and OTC Imodium per the package instructions until his visit tomorrow if he is unable to go to an Urgent Care.  Pt verbalized that he would have to miss work today.  RN advised the pt that he would receive a MD note for tomorrow when he comes for his scheduled visit.  Pt verbalized understanding.  Plans to come for office visit.

## 2013-08-09 ENCOUNTER — Encounter: Payer: Self-pay | Admitting: Internal Medicine

## 2013-08-09 ENCOUNTER — Ambulatory Visit (INDEPENDENT_AMBULATORY_CARE_PROVIDER_SITE_OTHER): Payer: Self-pay | Admitting: Internal Medicine

## 2013-08-09 VITALS — BP 114/80 | HR 107 | Temp 98.8°F | Wt 139.0 lb

## 2013-08-09 DIAGNOSIS — R197 Diarrhea, unspecified: Secondary | ICD-10-CM | POA: Insufficient documentation

## 2013-08-09 DIAGNOSIS — B20 Human immunodeficiency virus [HIV] disease: Secondary | ICD-10-CM

## 2013-08-09 LAB — COMPLETE METABOLIC PANEL WITH GFR
ALK PHOS: 62 U/L (ref 39–117)
ALT: 17 U/L (ref 0–53)
AST: 24 U/L (ref 0–37)
Albumin: 3.7 g/dL (ref 3.5–5.2)
BUN: 11 mg/dL (ref 6–23)
CO2: 24 mEq/L (ref 19–32)
Calcium: 8.9 mg/dL (ref 8.4–10.5)
Chloride: 104 mEq/L (ref 96–112)
Creat: 0.98 mg/dL (ref 0.50–1.35)
GFR, Est African American: >89 mL/min
GFR, Est Non African American: >89 mL/min
Glucose, Bld: 71 mg/dL (ref 70–99)
Potassium: 3.6 mEq/L (ref 3.5–5.3)
SODIUM: 135 meq/L (ref 135–145)
Total Bilirubin: 0.3 mg/dL (ref 0.2–1.2)
Total Protein: 8 g/dL (ref 6.0–8.3)

## 2013-08-09 LAB — LIPASE: Lipase: 204 U/L — ABNORMAL HIGH (ref 0–75)

## 2013-08-09 MED ORDER — DIPHENOXYLATE-ATROPINE 2.5-0.025 MG PO TABS: 2.0000 | ORAL_TABLET | Freq: Four times a day (QID) | ORAL | Status: AC | PRN

## 2013-08-09 NOTE — Assessment & Plan Note (Addendum)
Will try lomotil.  Likely from unsuppressed virus.  Discussed hydration, electrolytes.

## 2013-08-09 NOTE — Assessment & Plan Note (Signed)
Will check today with reflex to gentoype.  If ok, rtc 6 weeks.

## 2013-08-09 NOTE — Progress Notes (Signed)
   Subjective:    Patient ID: Jon Lewis, male    DOB: January 02, 1991, 23 y.o.   MRN: [id number]  HPI Here for a follow up visit.  Has HIV and poorly compliant since he first came in 2013.  Was on Stribild for about 1 month stopped.  Now came back into care and restarted his Stribild.  Has been on it now for about 1 month.  Is tolerating it well but continues to complain of diarrhea.  + weight loss.  CD4 280, viral load [id number].  Denies missed doses.  Has pancreatitis from an elevated lipase of 500.     Review of Systems  Constitutional: Positive for unexpected weight change. Negative for fever and fatigue.  HENT: Negative for trouble swallowing.   Gastrointestinal: Positive for diarrhea. Negative for nausea.  Skin: Negative for rash.  Neurological: Negative for dizziness and light-headedness.       Objective:   Physical Exam  Constitutional: He appears well-developed and well-nourished. No distress.  HENT:  Mouth/Throat: No oropharyngeal exudate.  Eyes: No scleral icterus.  Cardiovascular: Normal rate, regular rhythm and normal heart sounds.   No murmur heard. Pulmonary/Chest: Effort normal and breath sounds normal. No respiratory distress. He has no wheezes.  Lymphadenopathy:    He has no cervical adenopathy.  Skin: No rash noted.          Assessment & Plan:

## 2013-08-10 LAB — CBC WITH DIFFERENTIAL/PLATELET
Basophils Absolute: 0.3 10*3/uL — ABNORMAL HIGH (ref 0.0–0.1)
Basophils Relative: 3 % — ABNORMAL HIGH (ref 0–1)
EOS ABS: 0 10*3/uL (ref 0.0–0.7)
Eosinophils Relative: 0 % (ref 0–5)
HCT: 36.9 % — ABNORMAL LOW (ref 39.0–52.0)
Hemoglobin: 12.7 g/dL — ABNORMAL LOW (ref 13.0–17.0)
LYMPHS ABS: 3.4 10*3/uL (ref 0.7–4.0)
Lymphocytes Relative: 31 % (ref 12–46)
MCH: 29.5 pg (ref 26.0–34.0)
MCHC: 34.4 g/dL (ref 30.0–36.0)
MCV: 85.8 fL (ref 78.0–100.0)
Monocytes Absolute: 2.3 10*3/uL — ABNORMAL HIGH (ref 0.1–1.0)
Monocytes Relative: 21 % — ABNORMAL HIGH (ref 3–12)
NEUTROS PCT: 45 % (ref 43–77)
Neutro Abs: 5 10*3/uL (ref 1.7–7.7)
PLATELETS: 280 10*3/uL (ref 150–400)
RBC: 4.3 MIL/uL (ref 4.22–5.81)
RDW: 14.5 % (ref 11.5–15.5)
WBC: 11 10*3/uL — ABNORMAL HIGH (ref 4.0–10.5)

## 2013-08-10 LAB — HIV-1 RNA ULTRAQUANT REFLEX TO GENTYP+
HIV 1 RNA QUANT: 295735 {copies}/mL — AB (ref <20)
HIV-1 RNA Quant, Log: 5.47 {Log} — ABNORMAL HIGH (ref <1.30)

## 2013-08-10 LAB — T-HELPER CELL (CD4) - (RCID CLINIC ONLY)
CD4 % Helper T Cell: 11 % — ABNORMAL LOW (ref 33–55)
CD4 T Cell Abs: 400 /uL (ref 400–2700)

## 2013-08-23 LAB — HIV-1 GENOTYPR PLUS

## 2013-09-20 ENCOUNTER — Ambulatory Visit: Payer: Self-pay | Admitting: Internal Medicine

## 2014-07-19 ENCOUNTER — Encounter (HOSPITAL_COMMUNITY): Payer: Self-pay

## 2014-07-19 ENCOUNTER — Emergency Department (HOSPITAL_COMMUNITY): Payer: Self-pay

## 2014-07-19 ENCOUNTER — Emergency Department (HOSPITAL_COMMUNITY)
Admission: EM | Admit: 2014-07-19 | Discharge: 2014-07-19 | Discharge status: Against Medical Advice | Payer: Self-pay | Attending: Emergency Medicine | Admitting: Emergency Medicine

## 2014-07-19 DIAGNOSIS — B20 Human immunodeficiency virus [HIV] disease: Secondary | ICD-10-CM | POA: Insufficient documentation

## 2014-07-19 DIAGNOSIS — Z79899 Other long term (current) drug therapy: Secondary | ICD-10-CM | POA: Insufficient documentation

## 2014-07-19 DIAGNOSIS — M7989 Other specified soft tissue disorders: Secondary | ICD-10-CM | POA: Insufficient documentation

## 2014-07-19 DIAGNOSIS — Z720 Tobacco use: Secondary | ICD-10-CM | POA: Insufficient documentation

## 2014-07-19 DIAGNOSIS — J45909 Unspecified asthma, uncomplicated: Secondary | ICD-10-CM | POA: Insufficient documentation

## 2014-07-19 DIAGNOSIS — Z872 Personal history of diseases of the skin and subcutaneous tissue: Secondary | ICD-10-CM | POA: Insufficient documentation

## 2014-07-19 DIAGNOSIS — R5383 Other fatigue: Secondary | ICD-10-CM | POA: Insufficient documentation

## 2014-07-19 DIAGNOSIS — R509 Fever, unspecified: Secondary | ICD-10-CM | POA: Insufficient documentation

## 2014-07-19 HISTORY — DX: Human immunodeficiency virus (HIV) disease: B20

## 2014-07-19 MED ORDER — IBUPROFEN 800 MG PO TABS
800.0000 mg | ORAL_TABLET | Freq: Once | ORAL | Status: AC
  Administered 2014-07-19: 800 mg via ORAL
  Filled 2014-07-19: qty 1

## 2014-07-19 NOTE — ED Notes (Signed)
Pt states his index finger of right hand started swelling on Wednesday, states he is now running a fever up to 104. Pt states he last took tylenol yesterday

## 2014-07-19 NOTE — ED Notes (Signed)
Pt. Not in room. Looked for pt. In department. X-ray states that pt. Is not in their department. Another RN states that she saw pt. Walking towards department exit. Dr. Bebe Shaggy notified.

## 2014-07-19 NOTE — ED Provider Notes (Signed)
CSN: [id number]     Arrival date & time 07/19/14  0456 History   First MD Initiated Contact with Patient 07/19/14 0515     Chief Complaint  Patient presents with  . Fever  . Hand Pain   Patient gave verbal permission to utilize photo for medical documentation only The image was not stored on any personal device    Patient is a 24 y.o. male presenting with hand pain. The history is provided by the patient.  Hand Pain This is a new problem. The current episode started 2 days ago. The problem occurs daily. The problem has been gradually worsening. Pertinent negatives include no chest pain, no abdominal pain and no shortness of breath. The symptoms are aggravated by bending. The symptoms are relieved by rest.  pt reports right hand pain for 2 days.  Denies trauma.  He reports swelling/redness to hand.  He reports fever at home.  No vomiting/diarrhea.  No other symptoms.    Past Medical History  Diagnosis Date  . Asthma   . Keloid scar of skin   . Shingles   . HIV disease    Past Surgical History  Procedure Laterality Date  . Keloid excision     Family History  Problem Relation Age of Onset  . Hypertension Mother   . Fibromyalgia Mother    History  Substance Use Topics  . Smoking status: Current Some Day Smoker -- 0.00 packs/day    Types: Cigarettes, Cigars  . Smokeless tobacco: Never Used  . Alcohol Use: No    Review of Systems  Constitutional: Positive for fever and fatigue.  Respiratory: Negative for shortness of breath.   Cardiovascular: Negative for chest pain.  Gastrointestinal: Negative for abdominal pain.  Genitourinary: Negative for dysuria and discharge.  Musculoskeletal: Positive for arthralgias.  Neurological: Negative for weakness.  All other systems reviewed and are negative.     Allergies  Review of patient's allergies indicates no known allergies.  Home Medications   Prior to Admission medications   Medication Sig Start Date End Date Taking?  Authorizing Provider  acetaminophen (TYLENOL) 325 MG tablet Take 650 mg by mouth every 6 (six) hours as needed.   Yes Historical Provider, MD  diphenoxylate-atropine (LOMOTIL) 2.5-0.025 MG per tablet Take 2 tablets by mouth 4 (four) times daily as needed for diarrhea or loose stools. 08/09/13   Gardiner Barefoot, MD  elvitegravir-cobicistat-emtricitabine-tenofovir (STRIBILD) 150-150-200-300 MG TABS tablet Take 1 tablet by mouth daily with breakfast. 06/29/13   Judyann Munson, MD   BP 131/87 mmHg  Pulse 102  Temp(Src) 98.9 F (37.2 C) (Oral)  Resp 16  Ht 5\' 11"  (1.803 m)  Wt 172 lb (78.019 kg)  BMI 24.00 kg/m2  SpO2 98% Physical Exam CONSTITUTIONAL: Well developed/well nourished HEAD: Normocephalic/atraumatic EYES: EOMI/PERRL ENMT: Mucous membranes moist NECK: supple no meningeal signs SPINE/BACK:entire spine nontender CV: S1/S2 noted, no murmurs/rubs/gallops noted LUNGS: Lungs are clear to auscultation bilaterally, no apparent distress ABDOMEN: soft, nontender, no rebound or guarding, bowel sounds noted throughout abdomen GU:no cva tenderness NEURO: Pt is awake/alert/appropriate, moves all extremitiesx4.  No facial droop.   EXTREMITIES: pulses normal/equal, he has tenderness to right 2nd MCP.  He has no tenderness along flexor/extensor surface.  He has warmth/tenderness to MCP.  No crepitus.  No visible trauma or bite marks noted.  No erythematous streaking noted SKIN: warm PSYCH: no abnormalities of mood noted, alert and oriented to situation       ED Course  Procedures   When  asked about PMH, pt initially denied any conditions When I looked at his chart, it revealed h/o HIV He then admitted having HIV and admits he has not seen ID since last year and is not med compliant He denied any recent hospital visits in [place], but per care everywhere he has been seen at [facility] in 2016  I advised need for labs/imaging as concern for possible septic joint/infectious etiology  given his immune deficiency.  Pt initially agreeable, but I was later informed by nursing that patient eloped from the ED before workup complete.  Labs Review Labs Reviewed  CBC WITH DIFFERENTIAL/PLATELET  BASIC METABOLIC PANEL    Imaging Review No results found.   EKG Interpretation None      MDM   Final diagnoses:  Swelling of right hand    Nursing notes including past medical history and social history reviewed and considered in documentation     Zadie Rhine, MD 07/19/14 [phone]

## 2015-05-13 ENCOUNTER — Other Ambulatory Visit: Payer: Self-pay

## 2015-05-27 ENCOUNTER — Ambulatory Visit (INDEPENDENT_AMBULATORY_CARE_PROVIDER_SITE_OTHER): Payer: Self-pay | Admitting: Internal Medicine

## 2015-05-27 ENCOUNTER — Other Ambulatory Visit: Payer: Self-pay

## 2015-05-27 DIAGNOSIS — B20 Human immunodeficiency virus [HIV] disease: Secondary | ICD-10-CM

## 2015-05-27 DIAGNOSIS — Z79899 Other long term (current) drug therapy: Secondary | ICD-10-CM | POA: Insufficient documentation

## 2015-05-27 DIAGNOSIS — Z113 Encounter for screening for infections with a predominantly sexual mode of transmission: Secondary | ICD-10-CM | POA: Insufficient documentation

## 2015-05-27 LAB — COMPLETE METABOLIC PANEL WITH GFR
ALK PHOS: 84 U/L (ref 40–115)
ALT: 9 U/L (ref 9–46)
AST: 17 U/L (ref 10–40)
Albumin: 4.1 g/dL (ref 3.6–5.1)
BILIRUBIN TOTAL: 0.3 mg/dL (ref 0.2–1.2)
BUN: 11 mg/dL (ref 7–25)
CO2: 29 mmol/L (ref 20–31)
Calcium: 8.9 mg/dL (ref 8.6–10.3)
Chloride: 103 mmol/L (ref 98–110)
Creat: 1.2 mg/dL (ref 0.60–1.35)
GFR, Est African American: >89 mL/min (ref >=60)
GFR, Est Non African American: 84 mL/min (ref >=60)
GLUCOSE: 92 mg/dL (ref 65–99)
POTASSIUM: 4.3 mmol/L (ref 3.5–5.3)
Sodium: 136 mmol/L (ref 135–146)
Total Protein: 8 g/dL (ref 6.1–8.1)

## 2015-05-27 LAB — CBC WITH DIFFERENTIAL/PLATELET
BASOS ABS: 0 {cells}/uL (ref 0–200)
Basophils Relative: 0 %
Eosinophils Absolute: 392 cells/uL (ref 15–500)
Eosinophils Relative: 7 %
HCT: 40.9 % (ref 38.5–50.0)
HEMOGLOBIN: 13.6 g/dL (ref 13.2–17.1)
LYMPHS ABS: 1288 {cells}/uL (ref 850–3900)
Lymphocytes Relative: 23 %
MCH: 30.5 pg (ref 27.0–33.0)
MCHC: 33.3 g/dL (ref 32.0–36.0)
MCV: 91.7 fL (ref 80.0–100.0)
MPV: 10 fL (ref 7.5–12.5)
Monocytes Absolute: 784 cells/uL (ref 200–950)
Monocytes Relative: 14 %
NEUTROS PCT: 56 %
Neutro Abs: 3136 cells/uL (ref 1500–7800)
Platelets: 178 10*3/uL (ref 140–400)
RBC: 4.46 MIL/uL (ref 4.20–5.80)
RDW: 13.8 % (ref 11.0–15.0)
WBC: 5.6 10*3/uL (ref 3.8–10.8)

## 2015-05-27 LAB — LIPID PANEL
CHOLESTEROL: 148 mg/dL (ref 125–200)
HDL: 42 mg/dL (ref >=40)
LDL CALC: 90 mg/dL (ref <130)
Total CHOL/HDL Ratio: 3.5 Ratio (ref <=5.0)
Triglycerides: 82 mg/dL (ref <150)
VLDL: 16 mg/dL (ref <30)

## 2015-05-27 NOTE — Addendum Note (Signed)
Addended by: Mariea ClontsGREEN, Sera Hitsman D on: 05/27/2015 12:22 PM   Modules accepted: Orders

## 2015-05-27 NOTE — Progress Notes (Signed)
Came late and got labs.

## 2015-05-27 NOTE — Assessment & Plan Note (Signed)
No show, got labs.

## 2015-05-28 LAB — HIV-1 RNA QUANT-NO REFLEX-BLD
HIV 1 RNA QUANT: 65128 {copies}/mL — AB (ref <20)
HIV-1 RNA QUANT, LOG: 4.81 {Log_copies}/mL — AB (ref <1.30)

## 2015-05-28 LAB — T-HELPER CELL (CD4) - (RCID CLINIC ONLY)
CD4 T CELL HELPER: 15 % — AB (ref 33–55)
CD4 T Cell Abs: 200 /uL — ABNORMAL LOW (ref 400–2700)

## 2015-05-29 ENCOUNTER — Telehealth: Payer: Self-pay | Admitting: *Deleted

## 2015-05-29 LAB — URINE CYTOLOGY ANCILLARY ONLY
CHLAMYDIA, DNA PROBE: NEGATIVE
Neisseria Gonorrhea: NEGATIVE

## 2015-05-29 LAB — RPR TITER

## 2015-05-29 LAB — RPR: RPR: REACTIVE — AB

## 2015-05-29 NOTE — Telephone Encounter (Signed)
-----   Message from Gardiner Barefootobert W Comer, MD sent at 05/29/2015  4:01 PM EDT ----- Please let him know he is positive for syphilis and needs 1 IM bicillin injection.  thanks

## 2015-05-29 NOTE — Telephone Encounter (Signed)
Left patient a voice mail to call Dr. Ephriam Knucklesomer's office for lab results. Wendall MolaJacqueline Wang [place]

## 2015-05-30 LAB — FLUORESCENT TREPONEMAL AB(FTA)-IGG-BLD: Fluorescent Treponemal ABS: REACTIVE — AB

## 2015-05-31 LAB — HLA B*5701: HLA-B 5701 W/RFLX HLA-B HIGH: NEGATIVE

## 2015-06-04 ENCOUNTER — Telehealth: Payer: Self-pay | Admitting: *Deleted

## 2015-06-04 NOTE — Telephone Encounter (Signed)
-----   Message from Gardiner Barefootobert W Comer, MD sent at 05/29/2015  4:01 PM EDT ----- Please let him know he is positive for syphilis and needs 1 IM bicillin injection.  thanks

## 2015-06-04 NOTE — Telephone Encounter (Signed)
Will notify state as patient's phone no answer x 2, no voicemail, and emergency contact (Mom) # is not valid. Wendall MolaJacqueline Driana Dazey

## 2015-06-04 NOTE — Telephone Encounter (Signed)
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 They will follow back up with patient and feel he does not need treatment at this time. He has MD appt on 06/24/15. Please advise

## 2015-06-24 ENCOUNTER — Ambulatory Visit: Payer: Self-pay | Admitting: Internal Medicine

## 2016-10-09 ENCOUNTER — Emergency Department (HOSPITAL_COMMUNITY): Payer: No Typology Code available for payment source

## 2016-10-09 ENCOUNTER — Encounter (HOSPITAL_COMMUNITY): Payer: Self-pay | Admitting: Emergency Medicine

## 2016-10-09 ENCOUNTER — Emergency Department (HOSPITAL_COMMUNITY)
Admission: EM | Admit: 2016-10-09 | Discharge: 2016-10-09 | Disposition: A | Discharge status: Home / Self Care | Payer: No Typology Code available for payment source | Attending: Emergency Medicine | Admitting: Emergency Medicine

## 2016-10-09 DIAGNOSIS — J45909 Unspecified asthma, uncomplicated: Secondary | ICD-10-CM | POA: Diagnosis not present

## 2016-10-09 DIAGNOSIS — K6289 Other specified diseases of anus and rectum: Secondary | ICD-10-CM | POA: Diagnosis present

## 2016-10-09 DIAGNOSIS — F17210 Nicotine dependence, cigarettes, uncomplicated: Secondary | ICD-10-CM | POA: Insufficient documentation

## 2016-10-09 LAB — I-STAT CHEM 8, ED
BUN: 11 mg/dL (ref 6–20)
Calcium, Ion: 1.08 mmol/L — ABNORMAL LOW (ref 1.15–1.40)
Chloride: 107 mmol/L (ref 101–111)
Creatinine, Ser: 1.1 mg/dL (ref 0.61–1.24)
GLUCOSE: 83 mg/dL (ref 65–99)
HCT: 31 % — ABNORMAL LOW (ref 39.0–52.0)
Hemoglobin: 10.5 g/dL — ABNORMAL LOW (ref 13.0–17.0)
Potassium: 3.5 mmol/L (ref 3.5–5.1)
SODIUM: 143 mmol/L (ref 135–145)
TCO2: 27 mmol/L (ref 22–32)

## 2016-10-09 MED ORDER — IOPAMIDOL (ISOVUE-300) INJECTION 61%: 100.0000 mL | Freq: Once | INTRAVENOUS | Status: DC | PRN

## 2016-10-09 MED ORDER — LIDOCAINE HCL 2 % EX GEL
1.0000 "application " | Freq: Once | CUTANEOUS | Status: AC
  Administered 2016-10-09: 1 via TOPICAL
  Filled 2016-10-09: qty 10

## 2016-10-09 MED ORDER — MORPHINE SULFATE (PF) 4 MG/ML IV SOLN: 4.0000 mg | Freq: Once | INTRAVENOUS | Status: DC

## 2016-10-09 NOTE — ED Notes (Signed)
Patient refuses more testing.

## 2016-10-09 NOTE — ED Notes (Signed)
Patient refuses IV

## 2016-10-09 NOTE — ED Triage Notes (Signed)
Pt reports rectal pain since having surgery for a fissure about 2 months ago.

## 2016-10-09 NOTE — ED Notes (Signed)
Patient acknowledged the need for a CT. He states he will have a CT done just not today. Patient verbalized understanding of discharge instructions. Patient states he will come back if symptoms get worse.

## 2016-10-09 NOTE — ED Provider Notes (Signed)
[facility] DEPT Provider Note   CSN: [id number] Arrival date & time: 10/09/16  [id number]     History   Chief Complaint Chief Complaint  Patient presents with  . Rectal Pain    HPI Jon Lewis is a 26 y.o. male.  The history is provided by the patient.   26 year old male who presents with rectal pain. He has a history of asthma and HIV. He states that he had a fissure versus hemorrhoid surgery 2-1/2 months ago through no vomiting. Since then he has had gradually worsening rectal pain. States that even after his surgery he continues to have blood around his stools. No abdominal pain, nausea or vomiting. Has been having increasing pain, not improved with ibuprofen. Taking Miralax and stool soft, without straining.    Past Medical History:  Diagnosis Date  . Asthma   . HIV disease (HCC)   . Keloid scar of skin   . Shingles     Patient Active Problem List   Diagnosis Date Noted  . Screening examination for venereal disease 05/27/2015  . Encounter for long-term (current) use of medications 05/27/2015  . Diarrhea 08/09/2013  . Pancreatitis, acute 06/28/2013  . HIV disease (HCC) 01/05/2012    Past Surgical History:  Procedure Laterality Date  . KELOID EXCISION         Home Medications    Prior to Admission medications   Medication Sig Start Date End Date Taking? Authorizing Provider  acetaminophen (TYLENOL) 325 MG tablet Take 650 mg by mouth every 6 (six) hours as needed.    [provider]  diphenoxylate-atropine (LOMOTIL) 2.5-0.025 MG per tablet Take 2 tablets by mouth 4 (four) times daily as needed for diarrhea or loose stools. 08/09/13   Gardiner Barefootomer, Robert W, MD  elvitegravir-cobicistat-emtricitabine-tenofovir (STRIBILD) 150-150-200-300 MG TABS tablet Take 1 tablet by mouth daily with breakfast. 06/29/13   Judyann MunsonSnider, Cynthia, MD    Family History Family History  Problem Relation Age of Onset  . Hypertension Mother   . Fibromyalgia Mother     Social  History Social History  Substance Use Topics  . Smoking status: Current Some Day Smoker    Packs/day: 0.00    Types: Cigarettes, Cigars  . Smokeless tobacco: Never Used  . Alcohol use No     Allergies   Patient has no known allergies.   Review of Systems Review of Systems  Constitutional: Negative for fever.  Respiratory: Negative for cough.   Gastrointestinal: Positive for rectal pain. Negative for abdominal pain, constipation and diarrhea.  Genitourinary: Negative for dysuria and frequency.  Allergic/Immunologic: Positive for immunocompromised state.  Hematological: Does not bruise/bleed easily.  All other systems reviewed and are negative.    Physical Exam Updated Vital Signs BP (!) 154/100 (BP Location: Right Arm)   Ht 5\' 11"  (1.803 m)   Wt 75.3 kg (166 lb)   BMI 23.15 kg/m   Physical Exam Physical Exam  Nursing note and vitals reviewed. Constitutional: Well developed, well nourished, non-toxic, and in no acute distress Head: Normocephalic and atraumatic.  Mouth/Throat: Oropharynx is clear and moist.  Neck: Normal range of motion. Neck supple.  Cardiovascular: Normal rate and regular rhythm.   Pulmonary/Chest: Effort normal and breath sounds normal.  Abdominal: Soft. There is no tenderness. There is no rebound and no guarding.  no fissures or external hemorrhoids. There is skin breakdown with mild ulceration measuring less than 0.5 cm to the lateral aspect of the anus.  Musculoskeletal: Normal range of motion.  Neurological: Alert, no facial  droop, fluent speech, moves all extremities symmetrically Skin: Skin is warm and dry.  Psychiatric: Cooperative   ED Treatments / Results  Labs (all labs ordered are listed, but only abnormal results are displayed) Labs Reviewed  I-STAT CHEM 8, ED - Abnormal; Notable for the following:       Result Value   Calcium, Ion 1.08 (*)    Hemoglobin 10.5 (*)    HCT 31.0 (*)    All other components within normal limits     EKG  EKG Interpretation None       Radiology No results found.  Procedures Procedures (including critical care time)  Medications Ordered in ED Medications  morphine 4 MG/ML injection 4 mg (not administered)  iopamidol (ISOVUE-300) 61 % injection 100 mL (not administered)  lidocaine (XYLOCAINE) 2 % jelly 1 application (1 application Topical Given 10/09/16 0809)     Initial Impression / Assessment and Plan / ED Course  I have reviewed the triage vital signs and the nursing notes.  Pertinent labs & imaging results that were available during my care of the patient were reviewed by me and considered in my medical decision making (see chart for details).     Records reviewed. Patient was seen by ID 1 month ago with a CD4 count of 116.  Presenting with rectal pain. Afebrile, hemodynamically stable. Abdomen soft and benign. On external exam, there is a slight skin disruption ulceration lateral to the anus. But with significant pain with rectal exam. No obvious fluctuance, overlying skin changes otherwise is noted. However, given his immune suppression, concern for mild perirectal abscess or pelvic abscess. Proctitis also on differential.  We'll perform CT abdomen and pelvis.  Patient initially agreeable, but then refuses IV placement and CT abdomen and pelvis. I discussed with him that we are trying to rule out serious or even life-threatening processes such as a serious infection. He expressed understanding of this, and expressed understanding of the risk for delayed diagnosis including significant disability and even death. He has capacity to make decisions for himself. I encouraged him to return if he changes his mind. He states that he would rather follow up with colorectal surgery that he has seen through [facility].  Strict return and follow-up instructions reviewed. He expressed understanding of all discharge instructions and felt comfortable with the plan of care.   Final  Clinical Impressions(s) / ED Diagnoses   Final diagnoses:  Rectal pain    New Prescriptions New Prescriptions   No medications on file     Lavera Guise, MD 10/09/16 [phone]

## 2016-10-09 NOTE — Discharge Instructions (Signed)
Please call your colorectal surgeon for close follow-up and re-evaluation.  We recommended evaluation with CT scan today to rule out serious and potentially life threatening causes such as infection, but you wanted to defer at this time. Please return if you change your mind. Please return without fail for worsening symptoms, including fever, worsening pain/swelling, or any other symptoms concerning to you.

## 2018-07-03 DEATH — deceased
